# Patient Record
Sex: Female | Born: 1955
Health system: Southern US, Community
[De-identification: ages and names within clinical notes are randomized; demographics above are authoritative.]

## PROBLEM LIST (undated history)

## (undated) DIAGNOSIS — I10 Essential (primary) hypertension: Secondary | ICD-10-CM

## (undated) HISTORY — PX: TUBAL LIGATION: SHX77

---

## 1997-05-21 ENCOUNTER — Inpatient Hospital Stay (HOSPITAL_COMMUNITY): Admission: AD | Admit: 1997-05-21 | Discharge: 1997-05-21 | Payer: Self-pay | Admitting: *Deleted

## 1998-01-17 ENCOUNTER — Emergency Department (HOSPITAL_COMMUNITY): Admission: EM | Admit: 1998-01-17 | Discharge: 1998-01-17 | Payer: Self-pay | Admitting: Emergency Medicine

## 1998-01-17 ENCOUNTER — Encounter: Payer: Self-pay | Admitting: Emergency Medicine

## 2006-03-18 ENCOUNTER — Emergency Department (HOSPITAL_COMMUNITY): Admission: EM | Admit: 2006-03-18 | Discharge: 2006-03-18 | Payer: Self-pay | Admitting: Emergency Medicine

## 2006-03-20 ENCOUNTER — Emergency Department (HOSPITAL_COMMUNITY): Admission: EM | Admit: 2006-03-20 | Discharge: 2006-03-20 | Payer: Self-pay | Admitting: Emergency Medicine

## 2007-10-02 ENCOUNTER — Ambulatory Visit (HOSPITAL_COMMUNITY): Admission: RE | Admit: 2007-10-02 | Discharge: 2007-10-02 | Payer: Self-pay | Admitting: Family Medicine

## 2008-04-05 HISTORY — PX: COLON SURGERY: SHX602

## 2011-08-27 ENCOUNTER — Encounter (HOSPITAL_COMMUNITY): Payer: Self-pay | Admitting: Emergency Medicine

## 2011-08-27 ENCOUNTER — Emergency Department (HOSPITAL_COMMUNITY)
Admission: EM | Admit: 2011-08-27 | Discharge: 2011-08-27 | Disposition: A | Discharge status: Home / Self Care | Payer: No Typology Code available for payment source | Attending: Emergency Medicine | Admitting: Emergency Medicine

## 2011-08-27 ENCOUNTER — Emergency Department (HOSPITAL_COMMUNITY): Payer: No Typology Code available for payment source

## 2011-08-27 DIAGNOSIS — S161XXA Strain of muscle, fascia and tendon at neck level, initial encounter: Secondary | ICD-10-CM

## 2011-08-27 DIAGNOSIS — Y92410 Unspecified street and highway as the place of occurrence of the external cause: Secondary | ICD-10-CM | POA: Insufficient documentation

## 2011-08-27 DIAGNOSIS — S139XXA Sprain of joints and ligaments of unspecified parts of neck, initial encounter: Secondary | ICD-10-CM | POA: Insufficient documentation

## 2011-08-27 DIAGNOSIS — R51 Headache: Secondary | ICD-10-CM

## 2011-08-27 DIAGNOSIS — M542 Cervicalgia: Secondary | ICD-10-CM | POA: Insufficient documentation

## 2011-08-27 DIAGNOSIS — I10 Essential (primary) hypertension: Secondary | ICD-10-CM | POA: Insufficient documentation

## 2011-08-27 HISTORY — DX: Essential (primary) hypertension: I10

## 2011-08-27 MED ORDER — ONDANSETRON 8 MG PO TBDP
8.0000 mg | ORAL_TABLET | Freq: Once | ORAL | Status: AC
  Administered 2011-08-27: 8 mg via ORAL
  Filled 2011-08-27: qty 1

## 2011-08-27 MED ORDER — IBUPROFEN 800 MG PO TABS: 800.0000 mg | ORAL_TABLET | Freq: Three times a day (TID) | ORAL | Status: AC | PRN

## 2011-08-27 MED ORDER — HYDROCODONE-ACETAMINOPHEN 5-325 MG PO TABS: 1.0000 | ORAL_TABLET | Freq: Four times a day (QID) | ORAL | Status: AC | PRN

## 2011-08-27 NOTE — Discharge Instructions (Signed)
Your CT scans were normal here today.use ice and heat on her neck.  You will be more sore tomorrow and over the next week.

## 2011-08-27 NOTE — ED Provider Notes (Signed)
Medical screening examination/treatment/procedure(s) were performed by non-physician practitioner and as supervising physician I was immediately available for consultation/collaboration.   Lyanne Co, MD 08/27/11 1239
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
## 2012-02-16 ENCOUNTER — Ambulatory Visit (INDEPENDENT_AMBULATORY_CARE_PROVIDER_SITE_OTHER): Payer: Self-pay | Admitting: Family Medicine

## 2012-02-16 ENCOUNTER — Encounter: Payer: Self-pay | Admitting: Family Medicine

## 2012-02-16 VITALS — BP 123/84 | HR 86 | Temp 98.2°F | Ht 61.0 in | Wt 116.9 lb

## 2012-02-16 DIAGNOSIS — I10 Essential (primary) hypertension: Secondary | ICD-10-CM

## 2012-02-16 DIAGNOSIS — K635 Polyp of colon: Secondary | ICD-10-CM

## 2012-02-16 DIAGNOSIS — D126 Benign neoplasm of colon, unspecified: Secondary | ICD-10-CM

## 2012-02-16 NOTE — Patient Instructions (Addendum)
It was nice to meet you today, Monica Gutierrez. Your blood pressure is perfect.  Keep taking hydrochlorothiazide daily. Please find out who your stomach doctor is and call our clinic with their information. We need records from your stomach doctor. Call [facility] tomorrow to apply for an orange card. Once you have an orange card, schedule a complete physical exam with Dr. Tye Savoy.

## 2012-02-16 NOTE — Progress Notes (Signed)
  Subjective:    Patient ID: Monica Gutierrez, female    DOB: 02-07-1956, 56 y.o.   MRN: [id number]  HPI  Patient presents to clinic to establish care.  She has not seen a primary care doctor in a very long time.  She went to a free clinic and was told that her BP was elevated and she has been taking HCTZ since then.  Patient does not have insurance, but will apply for the orange card.  She says that she has a history of "stomach polyps."  She was seen by a GI physician in 2010, had a colonoscopy, and was told she had benign polyps (5 were removed).  Patient denies any constipation, diarrhea, or bloody stools at this time.  She cannot remember the name of the GI practice but says she will look for their card when she goes home.  She denies any unintentional weight loss or abdominal pain.  Patient would like to have a complete physical with pap smear.  She agrees to wait until she has an orange card.  I have reviewed and updated patient's current medications, allergies, PMH, FH, SH today.  Review of Systems  Per HPI    Objective:   Physical Exam  Constitutional: She appears well-nourished. No distress.  HENT:  Head: Normocephalic and atraumatic.  Abdominal: Soft. She exhibits no distension.       Assessment & Plan:

## 2012-02-16 NOTE — Assessment & Plan Note (Signed)
Patient says they were benign, but I cannot find records of this in Epic.  She will go home and look for the doctor's card, fill out ROI, and send it back to [place].

## 2012-02-16 NOTE — Assessment & Plan Note (Signed)
BP well controlled today.  Continue current regimen.  Return once she receives orange card for complete physical and fasting labs.

## 2013-11-22 ENCOUNTER — Encounter (INDEPENDENT_AMBULATORY_CARE_PROVIDER_SITE_OTHER): Payer: Self-pay

## 2013-11-22 ENCOUNTER — Other Ambulatory Visit: Payer: Self-pay | Admitting: Orthopedic Surgery

## 2013-11-22 ENCOUNTER — Ambulatory Visit
Admission: RE | Admit: 2013-11-22 | Discharge: 2013-11-22 | Disposition: A | Discharge status: Home / Self Care | Payer: BC Managed Care – PPO | Source: Ambulatory Visit | Attending: Orthopedic Surgery | Admitting: Orthopedic Surgery

## 2013-11-22 DIAGNOSIS — M779 Enthesopathy, unspecified: Secondary | ICD-10-CM

## 2014-01-02 ENCOUNTER — Other Ambulatory Visit: Payer: Self-pay | Admitting: Orthopedic Surgery

## 2014-01-02 DIAGNOSIS — M545 Low back pain: Secondary | ICD-10-CM

## 2014-01-07 ENCOUNTER — Ambulatory Visit
Admission: RE | Admit: 2014-01-07 | Discharge: 2014-01-07 | Disposition: A | Discharge status: Home / Self Care | Payer: BC Managed Care – PPO | Source: Ambulatory Visit | Attending: Orthopedic Surgery | Admitting: Orthopedic Surgery

## 2014-01-07 DIAGNOSIS — M545 Low back pain: Secondary | ICD-10-CM

## 2014-02-15 ENCOUNTER — Other Ambulatory Visit: Payer: Self-pay | Admitting: Orthopedic Surgery

## 2014-02-15 DIAGNOSIS — M5126 Other intervertebral disc displacement, lumbar region: Secondary | ICD-10-CM

## 2014-02-15 DIAGNOSIS — M5440 Lumbago with sciatica, unspecified side: Secondary | ICD-10-CM

## 2014-02-19 ENCOUNTER — Ambulatory Visit
Admission: RE | Admit: 2014-02-19 | Discharge: 2014-02-19 | Disposition: A | Discharge status: Home / Self Care | Payer: BC Managed Care – PPO | Source: Ambulatory Visit | Attending: Orthopedic Surgery | Admitting: Orthopedic Surgery

## 2014-02-19 VITALS — BP 119/80 | HR 93

## 2014-02-19 DIAGNOSIS — M545 Low back pain: Secondary | ICD-10-CM

## 2014-02-19 DIAGNOSIS — M5440 Lumbago with sciatica, unspecified side: Secondary | ICD-10-CM

## 2014-02-19 DIAGNOSIS — M5126 Other intervertebral disc displacement, lumbar region: Secondary | ICD-10-CM

## 2014-02-19 MED ORDER — METHYLPREDNISOLONE ACETATE 40 MG/ML INJ SUSP (RADIOLOG
120.0000 mg | Freq: Once | INTRAMUSCULAR | Status: AC
  Administered 2014-02-19: 120 mg via EPIDURAL

## 2014-02-19 MED ORDER — IOHEXOL 180 MG/ML  SOLN
1.0000 mL | Freq: Once | INTRAMUSCULAR | Status: AC | PRN
  Administered 2014-02-19: 1 mL via EPIDURAL

## 2014-02-19 NOTE — Discharge Instructions (Signed)

## 2015-01-02 ENCOUNTER — Ambulatory Visit
Admission: RE | Admit: 2015-01-02 | Discharge: 2015-01-02 | Disposition: A | Discharge status: Home / Self Care | Payer: 59 | Source: Ambulatory Visit | Attending: Orthopedic Surgery | Admitting: Orthopedic Surgery

## 2015-01-02 ENCOUNTER — Other Ambulatory Visit: Payer: Self-pay | Admitting: Orthopedic Surgery

## 2015-01-02 DIAGNOSIS — M545 Low back pain: Secondary | ICD-10-CM

## 2015-01-09 ENCOUNTER — Other Ambulatory Visit: Payer: Self-pay

## 2015-01-13 ENCOUNTER — Other Ambulatory Visit: Payer: Self-pay | Admitting: Orthopedic Surgery

## 2015-01-13 DIAGNOSIS — M5432 Sciatica, left side: Secondary | ICD-10-CM

## 2015-01-13 DIAGNOSIS — M5136 Other intervertebral disc degeneration, lumbar region: Secondary | ICD-10-CM

## 2015-01-16 ENCOUNTER — Ambulatory Visit
Admission: RE | Admit: 2015-01-16 | Discharge: 2015-01-16 | Disposition: A | Discharge status: Home / Self Care | Payer: 59 | Source: Ambulatory Visit | Attending: Orthopedic Surgery | Admitting: Orthopedic Surgery

## 2015-01-16 DIAGNOSIS — M5432 Sciatica, left side: Secondary | ICD-10-CM

## 2015-01-16 DIAGNOSIS — M5136 Other intervertebral disc degeneration, lumbar region: Secondary | ICD-10-CM

## 2015-01-16 MED ORDER — METHYLPREDNISOLONE ACETATE 40 MG/ML INJ SUSP (RADIOLOG
120.0000 mg | Freq: Once | INTRAMUSCULAR | Status: AC
  Administered 2015-01-16: 120 mg via EPIDURAL

## 2015-01-16 MED ORDER — IOHEXOL 180 MG/ML  SOLN
1.0000 mL | Freq: Once | INTRAMUSCULAR | Status: DC | PRN
  Administered 2015-01-16: 1 mL via EPIDURAL

## 2015-01-16 NOTE — Discharge Instructions (Signed)

## 2015-08-26 ENCOUNTER — Ambulatory Visit (INDEPENDENT_AMBULATORY_CARE_PROVIDER_SITE_OTHER): Payer: BLUE CROSS/BLUE SHIELD | Admitting: Physician Assistant

## 2015-08-26 VITALS — BP 125/82 | HR 87 | Temp 98.1°F | Resp 16 | Ht 60.0 in | Wt 116.2 lb

## 2015-08-26 DIAGNOSIS — K623 Rectal prolapse: Secondary | ICD-10-CM

## 2015-08-26 DIAGNOSIS — K6289 Other specified diseases of anus and rectum: Secondary | ICD-10-CM | POA: Diagnosis not present

## 2015-08-26 LAB — POC MICROSCOPIC URINALYSIS (UMFC): MUCUS RE: ABSENT

## 2015-08-26 LAB — POCT WET + KOH PREP
Trich by wet prep: ABSENT
Yeast by KOH: ABSENT
Yeast by wet prep: ABSENT

## 2015-08-26 LAB — POCT URINALYSIS DIP (MANUAL ENTRY)
Bilirubin, UA: NEGATIVE
Blood, UA: NEGATIVE
Glucose, UA: NEGATIVE
Ketones, POC UA: NEGATIVE
LEUKOCYTES UA: NEGATIVE
Nitrite, UA: NEGATIVE
PROTEIN UA: NEGATIVE
Spec Grav, UA: <=1.005
UROBILINOGEN UA: 0.2
pH, UA: 6.5

## 2015-08-26 NOTE — Patient Instructions (Addendum)
IF you received an x-ray today, you will receive an invoice from [facility]. Please contact [facility] at [phone] with questions or concerns regarding your invoice.   IF you received labwork today, you will receive an invoice from [employer]. Please contact [facility] at [phone] with questions or concerns regarding your invoice.   Our billing staff will not be able to assist you with questions regarding bills from these companies.  You will be contacted with the lab results as soon as they are available. The fastest way to get your results is to activate your My Chart account. Instructions are located on the last page of this paperwork. If you have not heard from [place] regarding the results in 2 weeks, please contact this office.    This appears to be a rectal prolapse.   I am sending you to a Education officer, environmental.  Please await contact.    Rectal Prolapse, Adult Rectal prolapse happens when the inside of the final section of the large intestine (rectum) pushes out through the anal opening. With this condition, the lower part of the rectum turns inside out. At first, rectal prolapse may be temporary. It may happen only when you are having a bowel movement. Over time, the prolapse will likely get worse. It may start to happen more often and cause uncomfortable symptoms. Eventually, the prolapse may happen when you are walking or simply standing. Surgery is often needed for this condition. CAUSES  This condition may result from weakness of the muscles that attach the rectum to the inside of the lower abdomen. The exact cause of this muscle weakness is not known.  RISK FACTORS This condition is more likely to develop in:  Women who are 59 years of age or older.  People with a history of constipation.  People with a history of hemorrhoids.  People who have a lower spinal cord injury.  Women who have been pregnant many times.  People  who have had rectal surgery.  Men who have an enlarged prostate gland.  People who have chronic obstructive pulmonary disease (COPD).  People who have cystic fibrosis. SYMPTOMS The main symptom of this condition is a red bump of tissue sticking out from your anus. At first, the bump may only appear after a bowel movement. It may then start to appear more often. Other symptoms may include:  Discomfort in the anus and rectum.  Constipation.  Diarrhea.  Inability to control bowel movements (incontinence).  Rectal bleeding. DIAGNOSIS  This condition may be diagnosed based on your symptoms and a physical exam. During the exam, you may be asked to squat and strain as though you are having a bowel movement. You may also have tests, such as:  A rectal exam using a flexible scope (sigmoidoscopy or colonoscopy).  A procedure that involves taking X-rays of your rectum after a dye (contrast material) is injected into the rectum (defecogram). TREATMENT  This condition is usually treated with surgery to repair the weakened muscles and to reconnect the rectum to attachments inside the lower abdomen. Other treatment options may include:  Pushing the prolapsed area back into the rectum (reduction). Your health care provider may do this by gently pushing it back in using a moist cloth. The health care provider may also show you how to do this at home if the prolapse occurs again.  Medicines to prevent constipation and straining. This may include laxatives or stool softeners. HOME CARE INSTRUCTIONS General Instructions  Take  over-the-counter and prescription medicines only as told by your health care provider.  Do not strain to have a bowel movement.  Do not lift anything that is heavier than 10 lb (4.5 kg).  Follow instructions from your health care provider about what to do if the prolapse occurs again and does not go back in. This may involve lying on your side and using a moist cloth to  gently press the lump into your rectum.  Keep all follow-up visits as told by your health care provider. This is important. Preventing Constipation  Eat foods that have a lot of fiber, such as fruits, vegetables, whole grains, and beans.  Limit foods high in fat and processed sugars, such as french fries, hamburgers, cookies, candies, and soda.  Drink enough fluids to keep your urine clear or pale yellow. SEEK MEDICAL CARE IF:  You have a fever.  Your prolapse cannot be reduced at home.  You have constipation or diarrhea.  You have mild rectal bleeding. SEEK IMMEDIATE MEDICAL CARE IF:  You have very bad rectal pain.  You bleed heavily from your rectum.   This information is not intended to replace advice given to you by your health care provider. Make sure you discuss any questions you have with your health care provider.   Document Released: 12/11/2014 Document Reviewed: 04/10/2014 Elsevier Interactive Patient Education [employer].

## 2015-08-31 NOTE — Progress Notes (Signed)
[facility] [address], [place] [address] [id number]  Date:  08/26/2015   Name:  Monica Gutierrez   DOB:  March 04, 1956   MRN:  [id number]  PCP:  Beverely Low, MD   Chief Complaint  Patient presents with  . Hemorrhoids    feel pressure on her rectum - feel like she has to have BM - don't know for sure what is it   x 2 months    History of Present Illness:  Monica Gutierrez is a 60 y.o. female patient who presents to [facility] for cc of anal pressure. Ptaient states that she had 2 months of rectal pressure.  She will have the sensation that she has to defecate, however she does not have to at the time.  It comes on with great urgency.  She walks a lot, and must derail from her walking back to the home, to use the bathroom, though nothing is expelled.  This is agravated by the walking, but also standing.  Relieved with sitting and lying down.  She has no urinary urgency, hematuria, or dysuria, or frequency.  No vaginal changes, discharge or irritation.  No fecal incontinence.  She has felt the same sensation with her vagina, and states that she limits sexual encounter to once per month due to the fear of a vaginal prolapse.  No heavy constipation.  No lifting heavy weighted materials.   She has 4 children.  Last 19 years ago, cesarian section.  3 vaginal deliveries.  Last colonoscopy 4 years ago, with request for recheck q5 years.   Pap last 2 years ago.    Patient Active Problem List   Diagnosis Date Noted  . Essential hypertension, benign 02/16/2012  . Colon polyps 02/16/2012    Past Medical History  Diagnosis Date  . Hypertension     Past Surgical History  Procedure Laterality Date  . Cesarean section    . Colon surgery  2010    5 polyps removed 2 years ago  . Tubal ligation      Social History  Substance Use Topics  . Smoking status: Light Tobacco Smoker -- 0.25 packs/day for 15 years    Types: Cigarettes  . Smokeless tobacco: None  . Alcohol Use: Yes      Comment: one beer on the holidays    History reviewed. No pertinent family history.  No Known Allergies  Medication list has been reviewed and updated.  Current Outpatient Prescriptions on File Prior to Visit  Medication Sig Dispense Refill  . Boswellia-Glucosamine-Vit D (GLUCOSAMINE COMPLEX PO) Take 1 capsule by mouth daily.    . hydrochlorothiazide (HYDRODIURIL) 25 MG tablet Take 25 mg by mouth daily.    Marland Kitchen OVER THE COUNTER MEDICATION Take 1 tablet by mouth daily. Allergy tablet, pink, $ over the counter at [facility]     No current facility-administered medications on file prior to visit.    ROS ROS otherwise unremarkable unless listed above.   Physical Examination: BP 125/82 mmHg  Pulse 87  Temp(Src) 98.1 F (36.7 C) (Oral)  Resp 16  Ht 5' (1.524 m)  Wt 116 lb 3.2 oz (52.708 kg)  BMI 22.69 kg/m2  SpO2 99% Ideal Body Weight: Weight in (lb) to have BMI = 25: 127.7  Physical Exam  Constitutional: She is oriented to person, place, and time. She appears well-developed and well-nourished. No distress.  HENT:  Head: Normocephalic and atraumatic.  Right Ear: External ear normal.  Left Ear:  External ear normal.  Eyes: Conjunctivae and EOM are normal. Pupils are equal, round, and reactive to light.  Cardiovascular: Normal rate.   Pulmonary/Chest: Effort normal. No respiratory distress.  Abdominal: Soft. Normal appearance and bowel sounds are normal. There is no hepatosplenomegaly. There is no tenderness.  Genitourinary: Vagina normal. Rectal exam shows anal tone normal. Pelvic exam was performed with patient supine. There is no rash on the right labia. There is no rash on the left labia. Cervix exhibits no discharge. Right adnexum displays no mass and no tenderness. Left adnexum displays no mass and no tenderness.  With standing, appears to have prolapse of the rectal with evaluation--this is with movement and manipulation, so somewhat unclear.    Neurological: She is alert and  oriented to person, place, and time.  Skin: She is not diaphoretic.  Psychiatric: She has a normal mood and affect. Her behavior is normal.    Results for orders placed or performed in visit on 08/26/15  POCT urinalysis dipstick  Result Value Ref Range   Color, UA yellow yellow   Clarity, UA clear clear   Glucose, UA negative negative   Bilirubin, UA negative negative   Ketones, POC UA negative negative   Spec Grav, UA <=1.005    Blood, UA negative negative   pH, UA 6.5    Protein Ur, POC negative negative   Urobilinogen, UA 0.2    Nitrite, UA Negative Negative   Leukocytes, UA Negative Negative  POCT Microscopic Urinalysis ([facility])  Result Value Ref Range   WBC,UR,HPF,POC None None WBC/hpf   RBC,UR,HPF,POC None None RBC/hpf   Bacteria None None, Too numerous to count   Mucus Absent Absent   Epithelial Cells, UR Per Microscopy None None, Too numerous to count cells/hpf  POCT Wet + KOH Prep  Result Value Ref Range   Yeast by KOH Absent Present, Absent   Yeast by wet prep Absent Present, Absent   WBC by wet prep Few None, Few, Too numerous to count   Clue Cells Wet Prep HPF POC None None, Too numerous to count   Trich by wet prep Absent Present, Absent   Bacteria Wet Prep HPF POC None None, Few, Too numerous to count   Epithelial Cells By [employer] ([facility]) Few None, Few, Too numerous to count   RBC,UR,HPF,POC None None RBC/hpf    Assessment and Plan: Monica Gutierrez is a 60 y.o. female who is here today for urinary urgency.  Appears to be rectal prolapse.  General surgery consult appreciated at this time.  I have advised that she continue to keep her stool loose.      Rectal prolapse - Plan: Ambulatory referral to General Surgery  Anal irritation - Plan: POCT urinalysis dipstick, POCT Microscopic Urinalysis ([facility]), POCT Wet + KOH Prep, Ambulatory referral to General Surgery   Ivar Drape, PA-C [facility] 08/31/2015 10:09  PM

## 2015-09-10 ENCOUNTER — Ambulatory Visit (INDEPENDENT_AMBULATORY_CARE_PROVIDER_SITE_OTHER): Payer: BLUE CROSS/BLUE SHIELD | Admitting: Physician Assistant

## 2015-09-10 VITALS — BP 102/70 | HR 93 | Temp 97.9°F | Resp 16 | Ht 60.0 in | Wt 115.8 lb

## 2015-09-10 DIAGNOSIS — Z1211 Encounter for screening for malignant neoplasm of colon: Secondary | ICD-10-CM

## 2015-09-10 DIAGNOSIS — Z131 Encounter for screening for diabetes mellitus: Secondary | ICD-10-CM

## 2015-09-10 DIAGNOSIS — Z0000 Encounter for general adult medical examination without abnormal findings: Secondary | ICD-10-CM

## 2015-09-10 DIAGNOSIS — Z1159 Encounter for screening for other viral diseases: Secondary | ICD-10-CM | POA: Diagnosis not present

## 2015-09-10 DIAGNOSIS — Z130 Encounter for screening for diseases of the blood and blood-forming organs and certain disorders involving the immune mechanism: Secondary | ICD-10-CM | POA: Diagnosis not present

## 2015-09-10 DIAGNOSIS — Z13220 Encounter for screening for lipoid disorders: Secondary | ICD-10-CM | POA: Diagnosis not present

## 2015-09-10 DIAGNOSIS — Z13228 Encounter for screening for other metabolic disorders: Secondary | ICD-10-CM

## 2015-09-10 DIAGNOSIS — Z1329 Encounter for screening for other suspected endocrine disorder: Secondary | ICD-10-CM

## 2015-09-10 LAB — CBC
HEMATOCRIT: 44 % (ref 35.0–45.0)
HEMOGLOBIN: 14.5 g/dL (ref 11.7–15.5)
MCH: 31.6 pg (ref 27.0–33.0)
MCHC: 33 g/dL (ref 32.0–36.0)
MCV: 95.9 fL (ref 80.0–100.0)
MPV: 8.8 fL (ref 7.5–12.5)
PLATELETS: 204 10*3/uL (ref 140–400)
RBC: 4.59 MIL/uL (ref 3.80–5.10)
RDW: 13.3 % (ref 11.0–15.0)
WBC: 5.7 10*3/uL (ref 3.8–10.8)

## 2015-09-10 LAB — HEMOGLOBIN A1C
Hgb A1c MFr Bld: 5.9 % — ABNORMAL HIGH (ref <5.7)
MEAN PLASMA GLUCOSE: 123 mg/dL

## 2015-09-10 LAB — COMPLETE METABOLIC PANEL WITH GFR
ALBUMIN: 4.5 g/dL (ref 3.6–5.1)
ALK PHOS: 58 U/L (ref 33–130)
ALT: 16 U/L (ref 6–29)
AST: 18 U/L (ref 10–35)
BILIRUBIN TOTAL: 0.4 mg/dL (ref 0.2–1.2)
BUN: 11 mg/dL (ref 7–25)
CO2: 26 mmol/L (ref 20–31)
CREATININE: 0.72 mg/dL (ref 0.50–1.05)
Calcium: 9.3 mg/dL (ref 8.6–10.4)
Chloride: 102 mmol/L (ref 98–110)
GFR, Est Non African American: >89 mL/min (ref >=60)
Glucose, Bld: 101 mg/dL — ABNORMAL HIGH (ref 65–99)
Potassium: 3.9 mmol/L (ref 3.5–5.3)
Sodium: 141 mmol/L (ref 135–146)
TOTAL PROTEIN: 7.4 g/dL (ref 6.1–8.1)

## 2015-09-10 LAB — LIPID PANEL
CHOLESTEROL: 261 mg/dL — AB (ref 125–200)
HDL: 60 mg/dL (ref >=46)
LDL CALC: 156 mg/dL — AB (ref <130)
Total CHOL/HDL Ratio: 4.4 Ratio (ref <=5.0)
Triglycerides: 226 mg/dL — ABNORMAL HIGH (ref <150)
VLDL: 45 mg/dL — AB (ref <30)

## 2015-09-10 LAB — HEPATITIS C ANTIBODY: HCV AB: NEGATIVE

## 2015-09-10 NOTE — Patient Instructions (Addendum)
IF you received an x-ray today, you will receive an invoice from [facility]. Please contact [facility] at [phone] with questions or concerns regarding your invoice.   IF you received labwork today, you will receive an invoice from [employer]. Please contact [facility] at [phone] with questions or concerns regarding your invoice.   Our billing staff will not be able to assist you with questions regarding bills from these companies.  You will be contacted with the lab results as soon as they are available. The fastest way to get your results is to activate your My Chart account. Instructions are located on the last page of this paperwork. If you have not heard from [place] regarding the results in 2 weeks, please contact this office.    Great job exercising, and your diet is sounds very good.  I will check your blood results, and we will see if there is anything that we need to discuss. I will get the medical records from Dr. Jimmye Norman in determining if when your colonoscopy and pap smear is needed.   We will have your lab results within 14 days.  Keeping You Healthy  Get These Tests  Blood Pressure- Have your blood pressure checked by your healthcare provider at least once a year.  Normal blood pressure is 120/80.  Weight- Have your body mass index (BMI) calculated to screen for obesity.  BMI is a measure of body fat based on height and weight.  You can calculate your own BMI at [employer].it  Cholesterol- Have your cholesterol checked every year.  Diabetes- Have your blood sugar checked every year if you have high blood pressure, high cholesterol, a family history of diabetes or if you are overweight.  Pap Test - Have a pap test every 1 to 5 years if you have been sexually active.  If you are older than 65 and recent pap tests have been normal you may not need additional pap tests.  In addition, if you have had a  hysterectomy  for benign disease additional pap tests are not necessary.  Mammogram-Yearly mammograms are essential for early detection of breast cancer  Screening for Colon Cancer- Colonoscopy starting at age 76. Screening may begin sooner depending on your family history and other health conditions.  Follow up colonoscopy as directed by your Gastroenterologist.  Screening for Osteoporosis- Screening begins at age 31 with bone density scanning, sooner if you are at higher risk for developing Osteoporosis.  Get these medicines  Calcium with Vitamin D- Your body requires 1200-1500 mg of Calcium a day and [id number] IU of Vitamin D a day.  You can only absorb 500 mg of Calcium at a time therefore Calcium must be taken in 2 or 3 separate doses throughout the day.  Hormones- Hormone therapy has been associated with increased risk for certain cancers and heart disease.  Talk to your healthcare provider about if you need relief from menopausal symptoms.  Aspirin- Ask your healthcare provider about taking Aspirin to prevent Heart Disease and Stroke.  Get these Immuniztions  Flu shot- Every fall  Pneumonia shot- Once after the age of 2; if you are younger ask your healthcare provider if you need a pneumonia shot.  Tetanus- Every ten years.  Zostavax- Once after the age of 65 to prevent shingles.  Take these steps  Don't smoke- Your healthcare provider can help you quit. For tips on how to quit, ask your healthcare provider or go to www.smokefree.gov or call  1-800 QUIT-NOW.  Be physically active- Exercise 5 days a week for a minimum of 30 minutes.  If you are not already physically active, start slow and gradually work up to 30 minutes of moderate physical activity.  Try walking, dancing, bike riding, swimming, etc.  Eat a healthy diet- Eat a variety of healthy foods such as fruits, vegetables, whole grains, low fat milk, low fat cheeses, yogurt, lean meats, chicken, fish, eggs, dried beans,  tofu, etc.  For more information go to www.thenutritionsource.org  Dental visit- Brush and floss teeth twice daily; visit your dentist twice a year.  Eye exam- Visit your Optometrist or Ophthalmologist yearly.  Drink alcohol in moderation- Limit alcohol intake to one drink or less a day.  Never drink and drive.  Depression- Your emotional health is as important as your physical health.  If you're feeling down or losing interest in things you normally enjoy, please talk to your healthcare provider.  Seat Belts- can save your life; always wear one  Smoke/Carbon Monoxide detectors- These detectors need to be installed on the appropriate level of your home.  Replace batteries at least once a year.  Violence- If anyone is threatening or hurting you, please tell your healthcare provider.  Living Will/ Health care power of attorney- Discuss with your healthcare provider and family.

## 2015-09-10 NOTE — Progress Notes (Signed)
[facility] [address] [place] [address] [phone]  Date:  09/10/2015   Name:  Monica Gutierrez   DOB:  October 22, 1955   MRN:  [id number]  PCP:  Beverlyn Roux, MD    History of Present Illness:  Monica Gutierrez is a 60 y.o. female patient who presents to [facility] for annual physical exam.    Diet: Rice, vegetables, carrots, lettuce, tomato, banana, apple.  Chicken, beef, pork, and fish. She avoids caffeine.  1 cup coffee, no sodas.    BM:  Stools loose, no blood in stool, constipation, or diarrhea.    Urination: no dysuria, hematuria, or frequency  Sleep: 8 hours through the night, sleeps through night  Exercise with walking daily for at least 30 minutes daily.    She works at a [employer] as a Freight forwarder. Married, 26 years.    EtOH: occasional, 1 beer Tobacco use: 1-2 cigarettes per day.   Substance use:    She was given prednisone 5 mg, daily for her joint     Patient Active Problem List   Diagnosis Date Noted  . Essential hypertension, benign 02/16/2012  . Colon polyps 02/16/2012    Past Medical History  Diagnosis Date  . Hypertension     Past Surgical History  Procedure Laterality Date  . Cesarean section    . Colon surgery  2010    5 polyps removed 2 years ago  . Tubal ligation      Social History  Substance Use Topics  . Smoking status: Light Tobacco Smoker -- 0.25 packs/day for 15 years    Types: Cigarettes  . Smokeless tobacco: None  . Alcohol Use: Yes     Comment: one beer on the holidays    History reviewed. No pertinent family history.  No Known Allergies  Medication list has been reviewed and updated.  Current Outpatient Prescriptions on File Prior to Visit  Medication Sig Dispense Refill  . hydrochlorothiazide (HYDRODIURIL) 25 MG tablet Take 25 mg by mouth daily.    Marland Kitchen OVER THE COUNTER MEDICATION Take 1 tablet by mouth daily. Allergy tablet, pink, $ over the counter at [facility]    . Boswellia-Glucosamine-Vit D  (GLUCOSAMINE COMPLEX PO) Take 1 capsule by mouth daily. Reported on 09/10/2015     No current facility-administered medications on file prior to visit.    Review of Systems  Constitutional: Negative for fever and chills.  HENT: Negative for ear discharge, ear pain and sore throat.   Eyes: Negative for blurred vision and double vision.  Respiratory: Negative for cough, shortness of breath and wheezing.   Cardiovascular: Negative for chest pain, palpitations and leg swelling.  Gastrointestinal: Negative for nausea, vomiting and diarrhea.  Genitourinary: Negative for dysuria, frequency and hematuria.  Skin: Negative for itching and rash.  Neurological: Negative for dizziness and headaches.     Physical Examination: BP 102/70 mmHg  Pulse 93  Temp(Src) 97.9 F (36.6 C) (Oral)  Resp 16  Ht 5' (1.524 m)  Wt 115 lb 12.8 oz (52.527 kg)  BMI 22.62 kg/m2  SpO2 98% Ideal Body Weight: Weight in (lb) to have BMI = 25: 127.7  Physical Exam  Constitutional: She is oriented to person, place, and time. She appears well-developed and well-nourished. No distress.  HENT:  Head: Normocephalic and atraumatic.  Right Ear: Tympanic membrane, external ear and ear canal normal.  Left Ear: Tympanic membrane, external ear and ear canal normal.  Nose: Right sinus exhibits no  maxillary sinus tenderness and no frontal sinus tenderness. Left sinus exhibits no maxillary sinus tenderness and no frontal sinus tenderness.  Mouth/Throat: Oropharynx is clear and moist. No uvula swelling. No oropharyngeal exudate, posterior oropharyngeal edema or posterior oropharyngeal erythema.  Eyes: Conjunctivae and EOM are normal. Pupils are equal, round, and reactive to light.  Neck: Normal range of motion. Neck supple. No thyromegaly present.  Cardiovascular: Normal rate, regular rhythm, normal heart sounds and intact distal pulses.  Exam reveals no gallop, no distant heart sounds and no friction rub.   No murmur  heard. Pulmonary/Chest: Effort normal and breath sounds normal. No respiratory distress. She has no decreased breath sounds. She has no wheezes. She has no rhonchi.  Abdominal: Soft. Bowel sounds are normal. She exhibits no distension and no mass. There is no tenderness.  Musculoskeletal: Normal range of motion. She exhibits no edema or tenderness.  Lymphadenopathy:       Head (right side): No submandibular, no tonsillar, no preauricular and no posterior auricular adenopathy present.       Head (left side): No submandibular, no tonsillar, no preauricular and no posterior auricular adenopathy present.    She has no cervical adenopathy.  Neurological: She is alert and oriented to person, place, and time. No cranial nerve deficit. She exhibits normal muscle tone. Coordination normal.  Skin: Skin is warm and dry. She is not diaphoretic.  Psychiatric: She has a normal mood and affect. Her behavior is normal.     Assessment and Plan: Monica Gutierrez is a 60 y.o. female who is here today for an annual physical exam.  -borderline diabetes.  Discussed better eating habits.   -pap performed with gynecologist. Annual physical exam - Plan: CBC, COMPLETE METABOLIC PANEL WITH GFR, TSH, Lipid panel, Hemoglobin A1c  Screening for deficiency anemia - Plan: CBC  Screening for metabolic disorder - Plan: COMPLETE METABOLIC PANEL WITH GFR  Screening for lipid disorders - Plan: Lipid panel  Screening for thyroid disorder - Plan: TSH  Screening for diabetes mellitus - Plan: Hemoglobin A1c  Need for hepatitis C screening test - Plan: Hepatitis C antibody  Screening for colon cancer  Ivar Drape, PA-C [facility] 09/10/2015 9:39 AM

## 2015-09-11 LAB — TSH: TSH: 1.02 mIU/L

## 2015-09-16 ENCOUNTER — Ambulatory Visit (INDEPENDENT_AMBULATORY_CARE_PROVIDER_SITE_OTHER): Payer: BLUE CROSS/BLUE SHIELD | Admitting: Physician Assistant

## 2015-09-16 VITALS — BP 116/70 | HR 84 | Temp 97.9°F | Resp 16 | Ht 60.0 in | Wt 115.0 lb

## 2015-09-16 DIAGNOSIS — M545 Low back pain: Secondary | ICD-10-CM

## 2015-09-16 DIAGNOSIS — M5416 Radiculopathy, lumbar region: Secondary | ICD-10-CM | POA: Diagnosis not present

## 2015-09-16 DIAGNOSIS — I10 Essential (primary) hypertension: Secondary | ICD-10-CM | POA: Diagnosis not present

## 2015-09-16 DIAGNOSIS — M79642 Pain in left hand: Secondary | ICD-10-CM | POA: Diagnosis not present

## 2015-09-16 LAB — SEDIMENTATION RATE: Sed Rate: 10 mm/hr (ref 0–30)

## 2015-09-16 MED ORDER — HYDROCHLOROTHIAZIDE 25 MG PO TABS: 25.0000 mg | ORAL_TABLET | Freq: Every day | ORAL | Status: DC

## 2015-09-16 NOTE — Patient Instructions (Addendum)
     IF you received an x-ray today, you will receive an invoice from [facility]. Please contact [facility] at [phone] with questions or concerns regarding your invoice.   IF you received labwork today, you will receive an invoice from [employer]. Please contact [facility] at [phone] with questions or concerns regarding your invoice.   Our billing staff will not be able to assist you with questions regarding bills from these companies.  You will be contacted with the lab results as soon as they are available. The fastest way to get your results is to activate your My Chart account. Instructions are located on the last page of this paperwork. If you have not heard from [place] regarding the results in 2 weeks, please contact this office.    This appears to be arthritis.  Let's do tylenol as needed.  Take with food.  I am referring you to an orthopedist.  Please await the contact.  Please obtain a fish oil/omega-3 fatty acid supplement.    Arthritis Arthritis means joint pain. It can also mean joint disease. A joint is a place where bones come together. People who have arthritis may have:  Red joints.  Swollen joints.  Stiff joints.  Warm joints.  A fever.  A feeling of being sick. HOME CARE Pay attention to any changes in your symptoms. Take these actions to help with your pain and swelling. Medicines  Take over-the-counter and prescription medicines only as told by your doctor.  Do not take aspirin for pain if your doctor says that you may have gout. Activities  Rest your joint if your doctor tells you to.  Avoid activities that make the pain worse.  Exercise your joint regularly as told by your doctor. Try doing exercises like:  Swimming.  Water aerobics.  Biking.  Walking. Joint Care  If your joint is swollen, keep it raised (elevated) if told by your doctor.  If your joint feels stiff in the morning, try taking  a warm shower.  If you have diabetes, do not apply heat without asking your doctor.  If told, apply heat to the joint:  Put a towel between the joint and the hot pack or heating pad.  Leave the heat on the area for 20-30 minutes.  If told, apply ice to the joint:  Put ice in a plastic bag.  Place a towel between your skin and the bag.  Leave the ice on for 20 minutes, 2-3 times per day.  Keep all follow-up visits as told by your doctor. GET HELP IF:  The pain gets worse.  You have a fever. GET HELP RIGHT AWAY IF:  You have very bad pain in your joint.  You have swelling in your joint.  Your joint is red.  Many joints become painful and swollen.  You have very bad back pain.  Your leg is very weak.  You cannot control your pee (urine) or poop (stool).   This information is not intended to replace advice given to you by your health care provider. Make sure you discuss any questions you have with your health care provider.   Document Released: 06/16/2009 Document Revised: 12/11/2014 Document Reviewed: 06/17/2014 Elsevier Interactive Patient Education [employer].

## 2015-09-16 NOTE — Progress Notes (Signed)
[facility] [address] [place] [address] [phone]  Date:  09/16/2015   Name:  Monica Gutierrez   DOB:  May 08, 1955   MRN:  [id number]  PCP:  Beverlyn Roux, MD    History of Present Illness:  Monica Gutierrez is a 61 y.o. female patient who presents to [facility] for cc of hand concerns, back pain, and blood pressure. Back: lower lumbar back pain with recent increased pain in the last week that radiates to left buttocks.  She will have numbness and tingling to the leg.  Patient is an avid walker and stretches daily.  She has had epidural injections once a year.  Followed by Dr. Marily Memos who recently retired.  No weakness.  No incontinence.   Hand: left 5th digit with swelling that has increased over the last few years.  It can be painful at times.  She was given prednisone by her pcp, which she takes one as needed (5mg ).  She reports no hx of RA.  No joint swelling of toes, ankle, knees, wrist.   BP refill.  Complaint on medication.  No chest pains, palpitations, sob, leg swelling, or vision changes.  No side effect.  No hx of strokes or TIA.   Patient Active Problem List   Diagnosis Date Noted  . Essential hypertension, benign 02/16/2012  . Colon polyps 02/16/2012    Past Medical History  Diagnosis Date  . Hypertension     Past Surgical History  Procedure Laterality Date  . Cesarean section    . Colon surgery  2010    5 polyps removed 2 years ago  . Tubal ligation      Social History  Substance Use Topics  . Smoking status: Light Tobacco Smoker -- 0.25 packs/day for 15 years    Types: Cigarettes  . Smokeless tobacco: None  . Alcohol Use: Yes     Comment: one beer on the holidays    History reviewed. No pertinent family history.  No Known Allergies  Medication list has been reviewed and updated.  Current Outpatient Prescriptions on File Prior to Visit  Medication Sig Dispense Refill  . Boswellia-Glucosamine-Vit D (GLUCOSAMINE COMPLEX PO) Take 1  capsule by mouth daily. Reported on 09/10/2015    . Calcium Carb-Cholecalciferol (CALCIUM + D3 PO) Take by mouth.    . hydrochlorothiazide (HYDRODIURIL) 25 MG tablet Take 25 mg by mouth daily.    Marland Kitchen LORATADINE PO Take by mouth.    Marland Kitchen OVER THE COUNTER MEDICATION Take 1 tablet by mouth daily. Allergy tablet, pink, $ over the counter at [facility]    . UNABLE TO FIND Med Name: Zakuror balls     No current facility-administered medications on file prior to visit.    ROS ROS otherwise unremarkable unless listed above.   Physical Examination: BP 116/70 mmHg  Pulse 84  Temp(Src) 97.9 F (36.6 C) (Oral)  Resp 16  Ht 5' (1.524 m)  Wt 115 lb (52.164 kg)  BMI 22.46 kg/m2  SpO2 96% Ideal Body Weight: Weight in (lb) to have BMI = 25: 127.7  Physical Exam  Constitutional: She is oriented to person, place, and time. She appears well-developed and well-nourished. No distress.  HENT:  Head: Normocephalic and atraumatic.  Right Ear: External ear normal.  Left Ear: External ear normal.  Eyes: Conjunctivae and EOM are normal. Pupils are equal, round, and reactive to light.  Cardiovascular: Normal rate, regular rhythm and normal heart sounds.  Exam reveals  no gallop and no friction rub.   No murmur heard. Pulmonary/Chest: Effort normal. No respiratory distress.  Musculoskeletal:       Lumbar back: She exhibits bony tenderness. She exhibits normal range of motion and no swelling.  Back: No erythema, swelling, or spasms appreciated.  Tender along the lumbar spinous.  Normal rom throughout hip/lower extremity.  Negative straight leg raise bilaterally.    Hand: Left hand with mildly erythematous dip without fluctuance.  Normal rom.  Nodular swelling of the dip throughout upper extremity digits.  Normal rom.  Sensation intact.  Neurological: She is alert and oriented to person, place, and time.  Skin: She is not diaphoretic.  Psychiatric: She has a normal mood and affect. Her behavior is normal.      Assessment and Plan: Monica Gutierrez is a 60 y.o. female who is here today for cc of back pain. Advised ice and tylenol use as needed. I am referring her back to an orthopedist at this time.  Referral sent for [facility].   Blood pressure refill for 6 months. Discussed labs from annual physical exam.  Will cut out the rice as much as possible.  Will recheck hemoglobin in 6-12 months.  Will start fish oil supplement daily.  Recheck lipid panel in 3-6 months. Left low back pain, with sciatica presence unspecified - Plan: Ambulatory referral to Orthopedic Surgery, Sedimentation Rate  Lumbar radiculopathy - Plan: Ambulatory referral to Orthopedic Surgery, Sedimentation Rate, CANCELED: AMB referral to orthopedics  Hand joint pain, left - Plan: Ambulatory referral to Orthopedic Surgery, Sedimentation Rate  Essential hypertension - Plan: hydrochlorothiazide (HYDRODIURIL) 25 MG tablet   Ivar Drape, PA-C [facility] 09/16/2015 8:30 AM

## 2015-10-24 ENCOUNTER — Ambulatory Visit: Payer: BLUE CROSS/BLUE SHIELD

## 2015-11-04 ENCOUNTER — Encounter: Payer: Self-pay | Admitting: Physician Assistant

## 2015-11-04 ENCOUNTER — Ambulatory Visit (INDEPENDENT_AMBULATORY_CARE_PROVIDER_SITE_OTHER): Payer: BLUE CROSS/BLUE SHIELD | Admitting: Physician Assistant

## 2015-11-04 VITALS — BP 102/74 | HR 87 | Temp 98.0°F | Resp 17 | Ht 60.0 in | Wt 114.0 lb

## 2015-11-04 DIAGNOSIS — G245 Blepharospasm: Secondary | ICD-10-CM | POA: Diagnosis not present

## 2015-11-04 DIAGNOSIS — K623 Rectal prolapse: Secondary | ICD-10-CM | POA: Insufficient documentation

## 2015-11-04 LAB — BASIC METABOLIC PANEL
BUN: 23 mg/dL (ref 7–25)
CALCIUM: 9.7 mg/dL (ref 8.6–10.4)
CO2: 31 mmol/L (ref 20–31)
CREATININE: 0.81 mg/dL (ref 0.50–1.05)
Chloride: 100 mmol/L (ref 98–110)
GLUCOSE: 109 mg/dL — AB (ref 65–99)
POTASSIUM: 4.6 mmol/L (ref 3.5–5.3)
Sodium: 141 mmol/L (ref 135–146)

## 2015-11-04 MED ORDER — TIZANIDINE HCL 2 MG PO TABS: 2.0000 mg | ORAL_TABLET | Freq: Three times a day (TID) | ORAL | Status: DC | PRN

## 2015-11-04 MED ORDER — TIZANIDINE HCL 2 MG PO CAPS: 2.0000 mg | ORAL_CAPSULE | Freq: Three times a day (TID) | ORAL | 0 refills | Status: DC | PRN

## 2015-11-04 NOTE — Patient Instructions (Addendum)
Blepharospasm Blepharospasm is a sudden tightening (spasm) of the muscles around your eyes (orbicularis oculi). It causes attacks of abnormal and uncontrollable blinking that come and go without warning. This type of abnormal muscle movement is called dystonia. Dystonia usually affects both eyes, but it does not affect other facial muscles or other parts of the body. Blepharospasm does not cause vision loss or lead to other serious physical problems. CAUSES Blepharospasm is caused by an abnormality in a part of your brain that is called the basal ganglion. The exact cause of the abnormality is not known. Stress, fatigue, eye irritation, or bright light may trigger attacks of blepharospasm. RISK FACTORS You may be at greater risk for blepharospasm if you:  Have a family history of blepharospasm.  Are female.  Are 73-34 years old. SIGNS AND SYMPTOMS  Eye irritation and dryness are the first symptoms of blepharospasm. Your eyes may also feel tired or irritated when they are exposed to bright lights. As blepharospasm gets worse, uncontrollable blinking becomes more frequent. Other eye symptoms may include:  Uncontrolled and tight closing.  Watering.  Dryness.  Eye muscle pain.  Gritty sensation.  Sensitivity to light. DIAGNOSIS Your health care provider may diagnose blepharospasm based on your symptoms and your medical history. Your health care provider may also do a physical exam to confirm the diagnosis. There are no blood tests or other tests to help diagnose blepharospasm. TREATMENT There is no cure for blepharospasm. Treatments that are used to manage the condition may include:  Botulinum toxin injections. Botulinum toxin is a substance that is produced by bacteria that cause muscle paralysis. Injecting this toxin into the muscles around the eye may control blepharospasm for up to three months. Injections are done with a tiny needle. They can be repeated as needed.  Medicines.  These include muscle relaxants, sedatives, and medicines that are called anticholinergics. Treatment with medicine is less successful than using injections.  Surgery. If other treatments have not worked, you may need surgery to remove part of the orbicularis oculi muscles (myectomy). HOME CARE INSTRUCTIONS Several home care management strategies may help. You may have to try different techniques to find what works best for you. These may include:  Avoiding triggers that may bring on your attacks.  Resting and avoiding stress as much as possible.  Applying gentle pressure to the side of your eye or face. Sometimes this can stop an attack.  Doing a specific activity that can halt an attack. Examples include laughing, singing, yawning, and chewing.  Wearing dark glasses and a sun visor to protect your eyes from bright light.  Trying alternative treatments such as acupuncture, biofeedback, hypnosis, or meditation.  Going to sleep or taking a nap. Blepharospasm usually stops during sleep. SEEK MEDICAL CARE IF:  Your attacks get worse or happen more often.  You are anxious or depressed because of your attacks.   This information is not intended to replace advice given to you by your health care provider. Make sure you discuss any questions you have with your health care provider.   Document Released: 03/25/2003 Document Revised: 12/11/2014 Document Reviewed: 11/13/2013 Elsevier Interactive Patient Education 2016 [employer].    IF you received an x-ray today, you will receive an invoice from [facility]. Please contact [facility] at [phone] with questions or concerns regarding your invoice.   IF you received labwork today, you will receive an invoice from [employer]. Please contact [facility] at [phone] with questions or concerns regarding your  invoice.   Our billing staff will not be able to assist you with questions regarding  bills from these companies.  You will be contacted with the lab results as soon as they are available. The fastest way to get your results is to activate your My Chart account. Instructions are located on the last page of this paperwork. If you have not heard from [place] regarding the results in 2 weeks, please contact this office.

## 2015-11-04 NOTE — Progress Notes (Signed)
Patient ID: SKYLEY [place], female   DOB: December 23, 1955, 60 y.o.   MRN: [id number] [facility] [address], [place] [address] [phone]  Date:  11/04/2015   Name:  Monica Gutierrez   DOB:  [birth year]   MRN:  [id number]  PCP:  Darci Needle, MD   By signing my name below, I, Ladene Artist, attest that this documentation has been prepared under the direction and in the presence of Ivar Drape, PA-C Electronically Signed: Ladene Artist, ED Scribe 11/04/2015 at 5:08 PM.  History of Present Illness:  Monica Gutierrez is a 60 y.o. female patient who presents to [facility] complaining of intermittent left eye twitching that has worsened over the past 2 months. Pt reports occasional pinching and electric shock sensations in her right occipital head; states that she last experienced this sensation a few days ago while lying down. No treatments tried PTA. She denies double vision, eye pain, rash, facial numbness.  Patient Active Problem List   Diagnosis Date Noted   Rectal mucosa prolapse 11/04/2015   Essential hypertension, benign 02/16/2012   Colon polyps 02/16/2012    Past Medical History:  Diagnosis Date   Hypertension     Past Surgical History:  Procedure Laterality Date   CESAREAN SECTION     COLON SURGERY  2010   5 polyps removed 2 years ago   TUBAL LIGATION      Social History  Substance Use Topics   Smoking status: Light Tobacco Smoker    Packs/day: 0.25    Years: 15.00    Types: Cigarettes   Smokeless tobacco: Not on file   Alcohol use Yes     Comment: one beer on the holidays    No family history on file.  No Known Allergies  Medication list has been reviewed and updated.  Current Outpatient Prescriptions on File Prior to Visit  Medication Sig Dispense Refill   Boswellia-Glucosamine-Vit D (GLUCOSAMINE COMPLEX PO) Take 1 capsule by mouth daily. Reported on 09/10/2015     Calcium Carb-Cholecalciferol (CALCIUM + D3 PO) Take by mouth.      hydrochlorothiazide (HYDRODIURIL) 25 MG tablet Take 1 tablet (25 mg total) by mouth daily. 30 tablet 5   LORATADINE PO Take by mouth.     OVER THE COUNTER MEDICATION Take 1 tablet by mouth daily. Allergy tablet, pink, $ over the counter at [place] Name: Zakuror balls     No current facility-administered medications on file prior to visit.     Review of Systems  Eyes: Negative for double vision and pain.  Skin: Negative for rash.    Physical Examination: BP 102/74 (BP Location: Right Arm, Patient Position: Sitting, Cuff Size: Normal)    Pulse 87    Temp 98 F (36.7 C) (Oral)    Resp 17    Ht 5' (1.524 m)    Wt 114 lb (51.7 kg)    SpO2 97%    BMI 22.26 kg/m  Ideal Body Weight: @FLOWAMB [id number]  Physical Exam  Constitutional: She is oriented to person, place, and time. She appears well-developed and well-nourished. No distress.  HENT:  Head: Normocephalic and atraumatic.  Right Ear: Tympanic membrane, external ear and ear canal normal.  Left Ear: Tympanic membrane, external ear and ear canal normal.  Eyes: Conjunctivae and EOM are normal. Pupils are equal, round, and reactive to light.  Very minimal twitching of the L upper eyelid.  Cardiovascular: Normal rate and regular rhythm.  Exam reveals no gallop and no friction rub.   No murmur heard. Pulmonary/Chest: Effort normal and breath sounds normal. No respiratory distress. She has no decreased breath sounds. She has no wheezes. She has no rhonchi. She has no rales.  Neurological: She is alert and oriented to person, place, and time.  Skin: She is not diaphoretic.  Psychiatric: She has a normal mood and affect. Her behavior is normal.    Assessment and Plan: Monica Gutierrez is a 60 y.o. female who is here today or cc of blepharospasm. Advised metabolic panel, and will use muscle relaxant also recommended thermotherapy.  Will increase rest, and contact as needed.  Blepharospasm - Plan: Basic  metabolic panel, DISCONTINUED: tiZANidine (ZANAFLEX) tablet 2 mg  Ivar Drape, PA-C [facility] 11/04/2015 3:52 PM

## 2015-11-30 ENCOUNTER — Encounter: Payer: Self-pay | Admitting: Physician Assistant

## 2015-12-15 ENCOUNTER — Ambulatory Visit (INDEPENDENT_AMBULATORY_CARE_PROVIDER_SITE_OTHER): Payer: BLUE CROSS/BLUE SHIELD | Admitting: Family Medicine

## 2015-12-15 VITALS — BP 120/70 | HR 74 | Temp 98.3°F | Resp 18 | Ht 60.0 in | Wt 116.2 lb

## 2015-12-15 DIAGNOSIS — K2100 Gastro-esophageal reflux disease with esophagitis, without bleeding: Secondary | ICD-10-CM

## 2015-12-15 DIAGNOSIS — K635 Polyp of colon: Secondary | ICD-10-CM

## 2015-12-15 MED ORDER — OMEPRAZOLE 40 MG PO CPDR: 40.0000 mg | DELAYED_RELEASE_CAPSULE | Freq: Every day | ORAL | 1 refills | Status: DC

## 2015-12-15 NOTE — Progress Notes (Signed)
Patient ID: Monica Gutierrez, female    DOB: 1955-09-03, 60 y.o.   MRN: [id number]  PCP: Darci Needle, MD  Chief Complaint  Patient presents with  . Gastroesophageal Reflux    Pt. feels a burning sensation in stomach. Also feels nauseous.     Subjective:   HPI 60 year old presents for evaluation of abdominal pain.  Patient reports nausea after eat, and when her stomach is empty. She has reduced intake of acid foods such as fruit in hopes of improvement of symptoms.  Experiencing bloating, burping, and sensation of reflux of stomach contents in thoart. Occasional cough with lying down and experiences heartburn. Awakens at night and gets up and drink water to relieve burning.  She has taken Zantac once per day and feels this has been helpful. No diarrhea. Regular stools everyday. Reports being told in the past that she has colon pylops but she has not followed for a repeat colonoscopy.  Social History   Social History  . Marital status: Married    Spouse name: N/A  . Number of children: N/A  . Years of education: N/A   Occupational History  . Not on file.   Social History Main Topics  . Smoking status: Light Tobacco Smoker    Packs/day: 0.25    Years: 15.00    Types: Cigarettes  . Smokeless tobacco: Not on file  . Alcohol use Yes     Comment: one beer on the holidays  . Drug use: No  . Sexual activity: Yes    Birth control/ protection: Surgical   Other Topics Concern  . Not on file   Social History Narrative   Lives in [place] with husband.  Children are 21, 19, and 14.  All live at home.   No family history on file.  Review of Systems See HPI   Patient Active Problem List   Diagnosis Date Noted  . Rectal mucosa prolapse 11/04/2015  . Essential hypertension, benign 02/16/2012  . Colon polyps 02/16/2012     Prior to Admission medications   Medication Sig Start Date End Date Taking? Authorizing Provider  Boswellia-Glucosamine-Vit D (GLUCOSAMINE  COMPLEX PO) Take 1 capsule by mouth daily. Reported on 09/10/2015   Yes Historical Provider, MD  Calcium Carb-Cholecalciferol (CALCIUM + D3 PO) Take by mouth.   Yes Historical Provider, MD  hydrochlorothiazide (HYDRODIURIL) 25 MG tablet Take 1 tablet (25 mg total) by mouth daily. 09/16/15  Yes Stephanie D English, PA  LORATADINE PO Take by mouth.   Yes Historical Provider, MD  OVER THE COUNTER MEDICATION Take 1 tablet by mouth daily. Allergy tablet, pink, $ over the counter at [facility]   Yes Historical Provider, MD  tizanidine (ZANAFLEX) 2 MG capsule Take 1 capsule (2 mg total) by mouth 3 (three) times daily as needed for muscle spasms. 11/04/15  Yes Dorian Heckle English, PA  UNABLE TO FIND Med Name: Zakuror balls   Yes Historical Provider, MD     No Known Allergies     Objective:  Physical Exam  Constitutional: She is oriented to person, place, and time. She appears well-developed and well-nourished.  HENT:  Head: Normocephalic and atraumatic.  Right Ear: External ear normal.  Left Ear: External ear normal.  Nose: Nose normal.  Mouth/Throat: Oropharynx is clear and moist.  Eyes: Conjunctivae and EOM are normal. Pupils are equal, round, and reactive to light.  Neck: Normal range of motion. Neck supple.  Pulmonary/Chest: Effort normal and breath sounds normal.  Abdominal: Soft. Bowel sounds are normal. She exhibits no mass. There is no tenderness. There is no rebound and no guarding.  Musculoskeletal: Normal range of motion.  Neurological: She is alert and oriented to person, place, and time.  Skin: Skin is warm and dry.  Psychiatric: She has a normal mood and affect. Her behavior is normal. Judgment and thought content normal.     . Vitals:   12/15/15 1049  BP: 120/70  Pulse: 74  Resp: 18  Temp: 98.3 F (36.8 C)   Assessment & Plan:  1. Gastroesophageal reflux disease with esophagitis, patient reports improvement with acid inhibitor.  Plan: -Start PPI omeprazole 40 mg, once  daily -Return if symptoms do not improve   2. Colon polyp, history colon polyp.  Patient has not followed up with Gastroenterology for repeat colonoscopy.  Plan:  - Ambulatory referral to Gastroenterology   Carroll Sage. Kenton Kingfisher, MSN, FNP-C [facility]

## 2015-12-15 NOTE — Patient Instructions (Addendum)
Gastroenterology will follow-up with you to schedule an appointment.  Start Omperazole 40 mg once daily for reflux symptoms.  IF you received an x-ray today, you will receive an invoice from [facility]. Please contact [facility] at [phone] with questions or concerns regarding your invoice.   IF you received labwork today, you will receive an invoice from [employer]. Please contact [facility] at [phone] with questions or concerns regarding your invoice.   Our billing staff will not be able to assist you with questions regarding bills from these companies.  You will be contacted with the lab results as soon as they are available. The fastest way to get your results is to activate your My Chart account. Instructions are located on the last page of this paperwork. If you have not heard from [place] regarding the results in 2 weeks, please contact this office.    Food Choices for Gastroesophageal Reflux Disease, Adult When you have gastroesophageal reflux disease (GERD), the foods you eat and your eating habits are very important. Choosing the right foods can help ease the discomfort of GERD. WHAT GENERAL GUIDELINES DO I NEED TO FOLLOW?  Choose fruits, vegetables, whole grains, low-fat dairy products, and low-fat meat, fish, and poultry.  Limit fats such as oils, salad dressings, butter, nuts, and avocado.  Keep a food diary to identify foods that cause symptoms.  Avoid foods that cause reflux. These may be different for different people.  Eat frequent small meals instead of three large meals each day.  Eat your meals slowly, in a relaxed setting.  Limit fried foods.  Cook foods using methods other than frying.  Avoid drinking alcohol.  Avoid drinking large amounts of liquids with your meals.  Avoid bending over or lying down until 2-3 hours after eating. WHAT FOODS ARE NOT RECOMMENDED? The following are some foods and drinks that  may worsen your symptoms: Vegetables Tomatoes. Tomato juice. Tomato and spaghetti sauce. Chili peppers. Onion and garlic. Horseradish. Fruits Oranges, grapefruit, and lemon (fruit and juice). Meats High-fat meats, fish, and poultry. This includes hot dogs, ribs, ham, sausage, salami, and bacon. Dairy Whole milk and chocolate milk. Sour cream. Cream. Butter. Ice cream. Cream cheese.  Beverages Coffee and tea, with or without caffeine. Carbonated beverages or energy drinks. Condiments Hot sauce. Barbecue sauce.  Sweets/Desserts Chocolate and cocoa. Donuts. Peppermint and spearmint. Fats and Oils High-fat foods, including [place] fries and potato chips. Other Vinegar. Strong spices, such as black pepper, white pepper, red pepper, cayenne, curry powder, cloves, ginger, and chili powder. The items listed above may not be a complete list of foods and beverages to avoid. Contact your dietitian for more information.   This information is not intended to replace advice given to you by your health care provider. Make sure you discuss any questions you have with your health care provider.   Document Released: 03/22/2005 Document Revised: 04/12/2014 Document Reviewed: 01/24/2013 Elsevier Interactive Patient Education [employer].

## 2016-05-10 ENCOUNTER — Encounter: Payer: Self-pay | Admitting: Family Medicine

## 2016-05-18 ENCOUNTER — Ambulatory Visit: Payer: BLUE CROSS/BLUE SHIELD | Admitting: Physician Assistant

## 2016-05-19 ENCOUNTER — Ambulatory Visit (INDEPENDENT_AMBULATORY_CARE_PROVIDER_SITE_OTHER): Payer: BLUE CROSS/BLUE SHIELD | Admitting: Physician Assistant

## 2016-05-19 ENCOUNTER — Telehealth: Payer: Self-pay

## 2016-05-19 VITALS — BP 90/60 | HR 88 | Temp 98.7°F | Resp 16 | Ht 60.5 in | Wt 112.2 lb

## 2016-05-19 DIAGNOSIS — I10 Essential (primary) hypertension: Secondary | ICD-10-CM | POA: Diagnosis not present

## 2016-05-19 DIAGNOSIS — R7303 Prediabetes: Secondary | ICD-10-CM

## 2016-05-19 DIAGNOSIS — E785 Hyperlipidemia, unspecified: Secondary | ICD-10-CM

## 2016-05-19 MED ORDER — HYDROCHLOROTHIAZIDE 25 MG PO TABS: 25.0000 mg | ORAL_TABLET | Freq: Every day | ORAL | 11 refills | Status: DC

## 2016-05-19 NOTE — Telephone Encounter (Signed)
In englishs' box

## 2016-05-19 NOTE — Progress Notes (Signed)
[facility] [address] [place] [address] [phone]  Date:  05/19/2016   Name:  Monica Gutierrez   DOB:  1956-02-20   MRN:  [id number]  PCP:  No primary care provider on file.    History of Present Illness:  Monica Gutierrez is a 61 y.o. female patient who presents to [facility] for dizziness and medication refill.     She is taking one hctz tablet per day. She has dizziness, once in a while.  No tremulousness .  She will at times feel like "an empty can".  She will eat a cheeseburger and it will return to normal.  She was having a cough for 3 weeks ago.  This has since resolved, but she states that she lost weight.  She is intentionally eating smaller portion and now eating milk, blueberry and orange.  She is trying to avoid diabetes.  Borderline 5.9.    Compliant with taking bp medication.  She is regimented with this.  No cp, dizziness, or palpitaitons.  No black or bloody stools.  No sob or dyspnea.  She generally has 1-2 cigarettes per day for years. She does report moments where she feels very lonely.  She feels it in the pit of her stomach.  She states that she used to have her children at home but they are at college, or out of state.  Husband works 9-9pm.  She copes with looking in the mirror, and saying you will be ok, you are doing fine.  She will go walking, or bowling.   Wt Readings from Last 3 Encounters:  05/19/16 112 lb 3.2 oz (50.9 kg)  12/15/15 116 lb 3.2 oz (52.7 kg)  11/04/15 114 lb (51.7 kg)      Patient Active Problem List   Diagnosis Date Noted  . Rectal mucosa prolapse 11/04/2015  . Essential hypertension, benign 02/16/2012  . Colon polyps 02/16/2012    Past Medical History:  Diagnosis Date  . Hypertension     Past Surgical History:  Procedure Laterality Date  . CESAREAN SECTION    . COLON SURGERY  2010   5 polyps removed 2 years ago  . TUBAL LIGATION      Social History  Substance Use Topics  . Smoking status: Former Smoker   Packs/day: 0.25    Years: 15.00    Types: Cigarettes  . Smokeless tobacco: Never Used  . Alcohol use Yes     Comment: one beer on the holidays    No family history on file.  No Known Allergies  Medication list has been reviewed and updated.  Current Outpatient Prescriptions on File Prior to Visit  Medication Sig Dispense Refill  . Boswellia-Glucosamine-Vit D (GLUCOSAMINE COMPLEX PO) Take 1 capsule by mouth daily. Reported on 09/10/2015    . Calcium Carb-Cholecalciferol (CALCIUM + D3 PO) Take by mouth.    . hydrochlorothiazide (HYDRODIURIL) 25 MG tablet Take 1 tablet (25 mg total) by mouth daily. 30 tablet 5  . LORATADINE PO Take by mouth.    Marland Kitchen omeprazole (PRILOSEC) 40 MG capsule Take 1 capsule (40 mg total) by mouth daily. 30 capsule 1  . OVER THE COUNTER MEDICATION Take 1 tablet by mouth daily. Allergy tablet, pink, $ over the counter at [facility]    . tizanidine (ZANAFLEX) 2 MG capsule Take 1 capsule (2 mg total) by mouth 3 (three) times daily as needed for muscle spasms. 20 capsule 0  . UNABLE TO FIND Med  Name: Zakuror balls     No current facility-administered medications on file prior to visit.     ROS ROS otherwise unremarkable unless listed above.   Physical Examination: BP 90/60   Pulse 88   Temp 98.7 F (37.1 C) (Oral)   Resp 16   Ht 5' 0.5" (1.537 m)   Wt 112 lb 3.2 oz (50.9 kg)   SpO2 97%   BMI 21.55 kg/m  Ideal Body Weight: Weight in (lb) to have BMI = 25: 129.9 Wt Readings from Last 3 Encounters:  05/19/16 112 lb 3.2 oz (50.9 kg)  12/15/15 116 lb 3.2 oz (52.7 kg)  11/04/15 114 lb (51.7 kg)    Physical Exam  Constitutional: She is oriented to person, place, and time. She appears well-developed and well-nourished. No distress.  HENT:  Head: Normocephalic and atraumatic.  Right Ear: External ear normal.  Left Ear: External ear normal.  Eyes: Conjunctivae and EOM are normal. Pupils are equal, round, and reactive to light.  Eyelid inverted with good  perfusion.  Cardiovascular: Normal rate, regular rhythm and intact distal pulses.  Exam reveals no friction rub.   No murmur heard. Pulmonary/Chest: Effort normal. No respiratory distress. She has no wheezes.  Neurological: She is alert and oriented to person, place, and time.  Skin: Capillary refill takes less than 2 seconds. She is not diaphoretic.  Psychiatric: She has a normal mood and affect. Her behavior is normal.    BP Readings from Last 3 Encounters:  05/19/16 90/60  12/15/15 120/70  11/04/15 102/74    Assessment and Plan: Monica Gutierrez is a 61 y.o. female who is here today for dizziness. I have advised that there is a concern that hctz may not be needed at this time.  She reports that she did not eat prior to this visit.  And had not eaten prior to her her lightheadedness.  I have advised to monitor her bp daily.  If systolic under [id number], she was advised to return immediately and dc anti-hypertensive where we will work up low bp.   She will not take her hctz today.  She states that she was removed from taking her bp medication but htn came back and placed back on.     Will recheck a1c and lipid panel.  Borderline diabetes - Plan: Lipid panel, Hemoglobin A1c  Hyperlipidemia, unspecified hyperlipidemia type - Plan: Lipid panel  Essential hypertension - Plan: hydrochlorothiazide (HYDRODIURIL) 25 MG tablet  Ivar Drape, PA-C [facility] 2/21/20189:09 AM

## 2016-05-19 NOTE — Telephone Encounter (Signed)
Pt dropped off colonoscopy results for English to review.  Paperwork was placed in nurses box at 102  [phone]

## 2016-05-19 NOTE — Patient Instructions (Addendum)
  I would like you to check your blood pressure three times per week.  If your blood pressure top number is below 100, I need you to let me know.  We will then try splitting your pill in half.  You do not want it too low, because you run the risk of fainting, or falling.   IF you received an x-ray today, you will receive an invoice from [facility]. Please contact [facility] at [phone] with questions or concerns regarding your invoice.   IF you received labwork today, you will receive an invoice from [facility]. Please contact [facility] at [phone] with questions or concerns regarding your invoice.   Our billing staff will not be able to assist you with questions regarding bills from these companies.  You will be contacted with the lab results as soon as they are available. The fastest way to get your results is to activate your My Chart account. Instructions are located on the last page of this paperwork. If you have not heard from [place] regarding the results in 2 weeks, please contact this office.

## 2016-05-20 LAB — LIPID PANEL
Chol/HDL Ratio: 5.4 ratio units — ABNORMAL HIGH (ref 0.0–4.4)
Cholesterol, Total: 279 mg/dL — ABNORMAL HIGH (ref 100–199)
HDL: 52 mg/dL (ref >39)
LDL Calculated: 168 mg/dL — ABNORMAL HIGH (ref 0–99)
TRIGLYCERIDES: 293 mg/dL — AB (ref 0–149)
VLDL CHOLESTEROL CAL: 59 mg/dL — AB (ref 5–40)

## 2016-05-20 LAB — HEMOGLOBIN A1C
Est. average glucose Bld gHb Est-mCnc: 120 mg/dL
Hgb A1c MFr Bld: 5.8 % — ABNORMAL HIGH (ref 4.8–5.6)

## 2016-07-06 ENCOUNTER — Other Ambulatory Visit: Payer: Self-pay | Admitting: Physician Assistant

## 2016-07-06 ENCOUNTER — Ambulatory Visit: Payer: BLUE CROSS/BLUE SHIELD

## 2016-07-06 DIAGNOSIS — E785 Hyperlipidemia, unspecified: Secondary | ICD-10-CM

## 2016-07-06 MED ORDER — ATORVASTATIN CALCIUM 40 MG PO TABS: 40.0000 mg | ORAL_TABLET | Freq: Every day | ORAL | 3 refills | Status: DC

## 2016-08-04 ENCOUNTER — Ambulatory Visit (INDEPENDENT_AMBULATORY_CARE_PROVIDER_SITE_OTHER): Payer: BLUE CROSS/BLUE SHIELD | Admitting: Physician Assistant

## 2016-08-04 ENCOUNTER — Encounter: Payer: Self-pay | Admitting: Physician Assistant

## 2016-08-04 VITALS — BP 115/71 | HR 85 | Temp 98.6°F | Resp 20 | Ht 60.5 in | Wt 116.6 lb

## 2016-08-04 DIAGNOSIS — R109 Unspecified abdominal pain: Secondary | ICD-10-CM

## 2016-08-04 DIAGNOSIS — M7989 Other specified soft tissue disorders: Secondary | ICD-10-CM

## 2016-08-04 DIAGNOSIS — M25541 Pain in joints of right hand: Secondary | ICD-10-CM

## 2016-08-04 DIAGNOSIS — M25542 Pain in joints of left hand: Secondary | ICD-10-CM

## 2016-08-04 LAB — POCT URINALYSIS DIP (MANUAL ENTRY)
Bilirubin, UA: NEGATIVE
Blood, UA: NEGATIVE
Glucose, UA: NEGATIVE mg/dL
Ketones, POC UA: NEGATIVE mg/dL
Leukocytes, UA: NEGATIVE
NITRITE UA: NEGATIVE
PH UA: 7 (ref 5.0–8.0)
Protein Ur, POC: NEGATIVE mg/dL
SPEC GRAV UA: 1.015 (ref 1.010–1.025)
Urobilinogen, UA: 0.2 E.U./dL

## 2016-08-04 MED ORDER — OMEPRAZOLE 20 MG PO CPDR: 20.0000 mg | DELAYED_RELEASE_CAPSULE | Freq: Every day | ORAL | 0 refills | Status: DC

## 2016-08-04 MED ORDER — MELOXICAM 7.5 MG PO TABS: 7.5000 mg | ORAL_TABLET | Freq: Every day | ORAL | 0 refills | Status: DC

## 2016-08-04 NOTE — Progress Notes (Signed)
PRIMARY CARE AT [facility] [address] [place] [address] [phone]  Date:  08/04/2016   Name:  Monica Gutierrez   DOB:  1955-05-27   MRN:  [id number]  PCP:  Ivar Drape, PA    History of Present Illness:  Monica Gutierrez is a 61 y.o. female patient who presents to PCP with  Chief Complaint  Patient presents with  . Abdominal Pain    on left side     Patient is here today for hand pain, and abdominal pain.  Patient states that the abdominal pain has been for months.  She has a pinching sensation on her side when she is sitting.  Standing or laying down will help.  It is not a palpable pain.  May happen for a few seconds, and then resolve.   Not associated with food.  No blood in stool, or black stool.  No dysuria, hematuria, or frequency.  No sob. She also has complaint of hand swelling.  She has seen this for more than a year.  She has a "feeling like it is swollen", though she is not positive that there is selling of her hand.  Joint pain and swelling.   Feels like her hands are tight and she can not make a grip.  She has noticed no redness or swelling.  She has attempted to sleep with her hands over her head, and wearing gloves which she says is not helping.  Feels like needles.  She is not performing repetitive movements.  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  She does not wear rings, as her joint swelling prevents her from getting them on for years.  Patient Active Problem List   Diagnosis Date Noted  . Rectal mucosa prolapse 11/04/2015  . Essential hypertension, benign 02/16/2012  . Colon polyps 02/16/2012    Past Medical History:  Diagnosis Date  . Hypertension     Past Surgical History:  Procedure Laterality Date  . CESAREAN SECTION    . COLON SURGERY  2010   5 polyps removed 2 years ago  . TUBAL LIGATION      Social History  Substance Use Topics  . Smoking status: Former Smoker    Packs/day: 0.25    Years: 15.00    Types: Cigarettes  . Smokeless  tobacco: Never Used  . Alcohol use 0.6 oz/week    1 Cans of beer per week     Comment: one beer on the holidays    History reviewed. No pertinent family history.  No Known Allergies  Medication list has been reviewed and updated.  Current Outpatient Prescriptions on File Prior to Visit  Medication Sig Dispense Refill  . atorvastatin (LIPITOR) 40 MG tablet Take 1 tablet (40 mg total) by mouth daily. 90 tablet 3  . Boswellia-Glucosamine-Vit D (GLUCOSAMINE COMPLEX PO) Take 1 capsule by mouth daily. Reported on 09/10/2015    . Calcium Carb-Cholecalciferol (CALCIUM + D3 PO) Take by mouth.    . hydrochlorothiazide (HYDRODIURIL) 25 MG tablet Take 1 tablet (25 mg total) by mouth daily. 30 tablet 11  . LORATADINE PO Take by mouth.    Marland Kitchen omeprazole (PRILOSEC) 40 MG capsule Take 1 capsule (40 mg total) by mouth daily. 30 capsule 1  . OVER THE COUNTER MEDICATION Take 1 tablet by mouth daily. Allergy tablet, pink, $ over the counter at [facility]    . tizanidine (ZANAFLEX) 2 MG capsule Take 1 capsule (2 mg total) by mouth 3 (three)  times daily as needed for muscle spasms. 20 capsule 0  . UNABLE TO FIND Med Name: Zakuror balls     No current facility-administered medications on file prior to visit.     ROS ROS otherwise unremarkable unless listed above.  Physical Examination: BP 115/71 (BP Location: Right Arm, Patient Position: Sitting, Cuff Size: Small)   Pulse 85   Temp 98.6 F (37 C) (Oral)   Resp 20   Ht 5' 0.5" (1.537 m)   Wt 116 lb 9.6 oz (52.9 kg)   SpO2 96%   BMI 22.40 kg/m  Ideal Body Weight: Weight in (lb) to have BMI = 25: 129.9  Physical Exam  Constitutional: She is oriented to person, place, and time. She appears well-developed and well-nourished. No distress.  HENT:  Head: Normocephalic and atraumatic.  Right Ear: External ear normal.  Left Ear: External ear normal.  Eyes: Conjunctivae and EOM are normal. Pupils are equal, round, and reactive to light.  Cardiovascular:  Normal rate.   Pulmonary/Chest: Effort normal. No respiratory distress.  Abdominal: Soft. Normal appearance and bowel sounds are normal. There is no hepatosplenomegaly. There is no tenderness.  Musculoskeletal:  Hands with joint nodularity at the distal joint of the digits without any specific deviation.  No erythema or swelling of the hand.  5/5 strength throughout the extremity. Negative phalen's bilaterally Negative tinels bilaterally  Neurological: She is alert and oriented to person, place, and time.  Skin: She is not diaphoretic.  Psychiatric: She has a normal mood and affect. Her behavior is normal.    Results for orders placed or performed in visit on 08/04/16  POCT urinalysis dipstick  Result Value Ref Range   Color, UA yellow yellow   Clarity, UA clear clear   Glucose, UA negative negative mg/dL   Bilirubin, UA negative negative   Ketones, POC UA negative negative mg/dL   Spec Grav, UA 1.015 1.010 - 1.025   Blood, UA negative negative   pH, UA 7.0 5.0 - 8.0   Protein Ur, POC negative negative mg/dL   Urobilinogen, UA 0.2 0.2 or 1.0 E.U./dL   Nitrite, UA Negative Negative   Leukocytes, UA Negative Negative     Assessment and Plan: Monica Gutierrez is a 61 y.o. female who is here today for cc of hand pain and abdominal pain. This appears to be pinching of the skin of the abdominal fat together.   Hand pain likely arthritis.  Advised aspirin daily, and giving her a small anti-inflammatory temporarily.  Advised to take with omeprazole.   Rheumatoid factor obtained.  Arthralgia of both hands - Plan: VITAMIN D 25 Hydroxy (Vit-D Deficiency, Fractures), omeprazole (PRILOSEC) 20 MG capsule, meloxicam (MOBIC) 7.5 MG tablet  Swelling of both hands - Plan: Sedimentation Rate, Rheumatoid factor, POCT urinalysis dipstick, VITAMIN D 25 Hydroxy (Vit-D Deficiency, Fractures)  Abdominal pain, unspecified abdominal location  Ivar Drape, PA-C [facility] 5/4/20188:33 AM

## 2016-08-04 NOTE — Patient Instructions (Addendum)
I am checking your vitamin D at this time. I would like you to start taking 81mg  aspirin daily at this time.  You can also take tylenol for your pain.  I will contact you with the result of your blood work.     Osteoarthritis Osteoarthritis is a type of arthritis that affects tissue that covers the ends of bones in joints (cartilage). Cartilage acts as a cushion between the bones and helps them move smoothly. Osteoarthritis results when cartilage in the joints gets worn down. Osteoarthritis is sometimes called "wear and tear" arthritis. Osteoarthritis is the most common form of arthritis. It often occurs in older people. It is a condition that gets worse over time (a progressive condition). Joints that are most often affected by this condition are in:  Fingers.  Toes.  Hips.  Knees.  Spine, including neck and lower back. What are the causes? This condition is caused by age-related wearing down of cartilage that covers the ends of bones. What increases the risk? The following factors may make you more likely to develop this condition:  Older age.  Being overweight or obese.  Overuse of joints, such as in athletes.  Past injury of a joint.  Past surgery on a joint.  Family history of osteoarthritis. What are the signs or symptoms? The main symptoms of this condition are pain, swelling, and stiffness in the joint. The joint may lose its shape over time. Small pieces of bone or cartilage may break off and float inside of the joint, which may cause more pain and damage to the joint. Small deposits of bone (osteophytes) may grow on the edges of the joint. Other symptoms may include:  A grating or scraping feeling inside the joint when you move it.  Popping or creaking sounds when you move. Symptoms may affect one or more joints. Osteoarthritis in a major joint, such as your knee or hip, can make it painful to walk or exercise. If you have osteoarthritis in your hands, you might not  be able to grip items, twist your hand, or control small movements of your hands and fingers (fine motor skills). How is this diagnosed? This condition may be diagnosed based on:  Your medical history.  A physical exam.  Your symptoms.  X-rays of the affected joint(s).  Blood tests to rule out other types of arthritis. How is this treated? There is no cure for this condition, but treatment can help to control pain and improve joint function. Treatment plans may include:  A prescribed exercise program that allows for rest and joint relief. You may work with a physical therapist.  A weight control plan.  Pain relief techniques, such as:  Applying heat and cold to the joint.  Electric pulses delivered to nerve endings under the skin (transcutaneous electrical nerve stimulation, or TENS).  Massage.  Certain nutritional supplements.  NSAIDs or prescription medicines to help relieve pain.  Medicine to help relieve pain and inflammation (corticosteroids). This can be given by mouth (orally) or as an injection.  Assistive devices, such as a brace, wrap, splint, specialized glove, or cane.  Surgery, such as:  An osteotomy. This is done to reposition the bones and relieve pain or to remove loose pieces of bone and cartilage.  Joint replacement surgery. You may need this surgery if you have very bad (advanced) osteoarthritis. Follow these instructions at home: Activity   Rest your affected joints as directed by your health care provider.  Do not drive or use heavy  machinery while taking prescription pain medicine.  Exercise as directed. Your health care provider or physical therapist may recommend specific types of exercise, such as:  Strengthening exercises. These are done to strengthen the muscles that support joints that are affected by arthritis. They can be performed with weights or with exercise bands to add resistance.  Aerobic activities. These are exercises, such as  brisk walking or water aerobics, that get your heart pumping.  Range-of-motion activities. These keep your joints easy to move.  Balance and agility exercises. Managing pain, stiffness, and swelling   If directed, apply heat to the affected area as often as told by your health care provider. Use the heat source that your health care provider recommends, such as a moist heat pack or a heating pad.  If you have a removable assistive device, remove it as told by your health care provider.  Place a towel between your skin and the heat source. If your health care provider tells you to keep the assistive device on while you apply heat, place a towel between the assistive device and the heat source.  Leave the heat on for 20-30 minutes.  Remove the heat if your skin turns bright red. This is especially important if you are unable to feel pain, heat, or cold. You may have a greater risk of getting burned.  If directed, put ice on the affected joint:  If you have a removable assistive device, remove it as told by your health care provider.  Put ice in a plastic bag.  Place a towel between your skin and the bag. If your health care provider tells you to keep the assistive device on during icing, place a towel between the assistive device and the bag.  Leave the ice on for 20 minutes, 2-3 times a day. General instructions   Take over-the-counter and prescription medicines only as told by your health care provider.  Maintain a healthy weight. Follow instructions from your health care provider for weight control. These may include dietary restrictions.  Do not use any products that contain nicotine or tobacco, such as cigarettes and e-cigarettes. These can delay bone healing. If you need help quitting, ask your health care provider.  Use assistive devices as directed by your health care provider.  Keep all follow-up visits as told by your health care provider. This is important. Where to find  more information:  [employer] of Arthritis and Musculoskeletal and Skin Diseases: www.niams.SouthExposed.es  [employer] on Aging: http://kim-miller.com/  American College of Rheumatology: www.rheumatology.org Contact a health care provider if:  Your skin turns red.  You develop a rash.  You have pain that gets worse.  You have a fever along with joint or muscle aches. Get help right away if:  You lose a lot of weight.  You suddenly lose your appetite.  You have night sweats. Summary  Osteoarthritis is a type of arthritis that affects tissue covering the ends of bones in joints (cartilage).  This condition is caused by age-related wearing down of cartilage that covers the ends of bones.  The main symptom of this condition is pain, swelling, and stiffness in the joint.  There is no cure for this condition, but treatment can help to control pain and improve joint function. This information is not intended to replace advice given to you by your health care provider. Make sure you discuss any questions you have with your health care provider. Document Released: 03/22/2005 Document Revised: 11/24/2015 Document Reviewed: 11/24/2015 Elsevier  Interactive Patient Education  2017 [employer].   IF you received an x-ray today, you will receive an invoice from [facility]. Please contact [facility] at [phone] with questions or concerns regarding your invoice.   IF you received labwork today, you will receive an invoice from [place]. Please contact [facility] at [phone] with questions or concerns regarding your invoice.   Our billing staff will not be able to assist you with questions regarding bills from these companies.  You will be contacted with the lab results as soon as they are available. The fastest way to get your results is to activate your My Chart account. Instructions are located on the last page of this paperwork. If you have not heard from [place]  regarding the results in 2 weeks, please contact this office.

## 2016-08-05 ENCOUNTER — Other Ambulatory Visit: Payer: Self-pay | Admitting: Physician Assistant

## 2016-08-05 DIAGNOSIS — E559 Vitamin D deficiency, unspecified: Secondary | ICD-10-CM

## 2016-08-05 LAB — RHEUMATOID FACTOR: Rhuematoid fact SerPl-aCnc: <10 IU/mL (ref 0.0–13.9)

## 2016-08-05 LAB — SEDIMENTATION RATE: Sed Rate: 3 mm/hr (ref 0–40)

## 2016-08-05 LAB — VITAMIN D 25 HYDROXY (VIT D DEFICIENCY, FRACTURES): VIT D 25 HYDROXY: 28.9 ng/mL — AB (ref 30.0–100.0)

## 2016-08-05 MED ORDER — ERGOCALCIFEROL 1.25 MG (50000 UT) PO CAPS: 50000.0000 [IU] | ORAL_CAPSULE | ORAL | 0 refills | Status: AC

## 2016-10-11 ENCOUNTER — Other Ambulatory Visit: Payer: Self-pay | Admitting: Physician Assistant

## 2016-10-11 ENCOUNTER — Ambulatory Visit (INDEPENDENT_AMBULATORY_CARE_PROVIDER_SITE_OTHER): Payer: BLUE CROSS/BLUE SHIELD | Admitting: Physician Assistant

## 2016-10-11 ENCOUNTER — Encounter: Payer: Self-pay | Admitting: Physician Assistant

## 2016-10-11 VITALS — BP 112/71 | HR 85 | Temp 98.4°F | Resp 18 | Ht 60.5 in | Wt 117.0 lb

## 2016-10-11 DIAGNOSIS — E782 Mixed hyperlipidemia: Secondary | ICD-10-CM | POA: Diagnosis not present

## 2016-10-11 DIAGNOSIS — E559 Vitamin D deficiency, unspecified: Secondary | ICD-10-CM | POA: Diagnosis not present

## 2016-10-11 DIAGNOSIS — R059 Cough, unspecified: Secondary | ICD-10-CM

## 2016-10-11 DIAGNOSIS — R05 Cough: Secondary | ICD-10-CM

## 2016-10-11 MED ORDER — ERGOCALCIFEROL 1.25 MG (50000 UT) PO CAPS: 50000.0000 [IU] | ORAL_CAPSULE | ORAL | 0 refills | Status: DC

## 2016-10-11 MED ORDER — CLINDAMYCIN HCL 150 MG PO CAPS: 450.0000 mg | ORAL_CAPSULE | Freq: Three times a day (TID) | ORAL | 0 refills | Status: AC

## 2016-10-11 MED ORDER — RANITIDINE HCL 150 MG PO TABS: 150.0000 mg | ORAL_TABLET | Freq: Two times a day (BID) | ORAL | 0 refills | Status: DC

## 2016-10-11 MED ORDER — BENZONATATE 100 MG PO CAPS: 100.0000 mg | ORAL_CAPSULE | Freq: Three times a day (TID) | ORAL | 0 refills | Status: DC | PRN

## 2016-10-11 NOTE — Patient Instructions (Addendum)
Within the next 2-4 weeks, please return for a labs only in regards to your vitamin d and lipid panel Clindamycin capsules What is this medicine? CLINDAMYCIN ([place] sin) is a lincosamide antibiotic. It is used to treat certain kinds of bacterial infections. It will not work for colds, flu, or other viral infections. This medicine may be used for other purposes; ask your health care provider or pharmacist if you have questions. COMMON BRAND NAME(S): Cleocin What should I tell my health care provider before I take this medicine? They need to know if you have any of these conditions: -kidney disease -liver disease -stomach problems like colitis -an unusual or allergic reaction to clindamycin, lincomycin, or other medicines, foods, dyes like tartrazine or preservatives -pregnant or trying to get pregnant -breast-feeding How should I use this medicine? Take this medicine by mouth with a full glass of water. Follow the directions on the prescription label. You can take this medicine with food or on an empty stomach. If the medicine upsets your stomach, take it with food. Take your medicine at regular intervals. Do not take your medicine more often than directed. Take all of your medicine as directed even if you think your are better. Do not skip doses or stop your medicine early. Talk to your pediatrician regarding the use of this medicine in children. Special care may be needed. Overdosage: If you think you have taken too much of this medicine contact a poison control center or emergency room at once. NOTE: This medicine is only for you. Do not share this medicine with others. What if I miss a dose? If you miss a dose, take it as soon as you can. If it is almost time for your next dose, take only that dose. Do not take double or extra doses. What may interact with this medicine? -birth control pills -erythromycin -medicines that relax muscles for surgery -rifampin This list may not  describe all possible interactions. Give your health care provider a list of all the medicines, herbs, non-prescription drugs, or dietary supplements you use. Also tell them if you smoke, drink alcohol, or use illegal drugs. Some items may interact with your medicine. What should I watch for while using this medicine? Tell your doctor or healthcare professional if your symptoms do not start to get better or if they get worse. Do not treat diarrhea with over the counter products. Contact your doctor if you have diarrhea that lasts more than 2 days or if it is severe and watery. What side effects may I notice from receiving this medicine? Side effects that you should report to your doctor or health care professional as soon as possible: -allergic reactions like skin rash, itching or hives, swelling of the face, lips, or tongue -dark urine -pain on swallowing -redness, blistering, peeling or loosening of the skin, including inside the mouth -unusual bleeding or bruising -unusually weak or tired -yellowing of eyes or skin Side effects that usually do not require medical attention (report to your doctor or health care professional if they continue or are bothersome): -diarrhea -itching in the rectal or genital area -joint pain -nausea, vomiting -stomach pain This list may not describe all possible side effects. Call your doctor for medical advice about side effects. You may report side effects to FDA at 1-800-FDA-1088. Where should I keep my medicine? Keep out of the reach of children. Store at room temperature between 20 and 25 degrees C (68 and 77 degrees F). Throw away any unused  medicine after the expiration date. NOTE: This sheet is a summary. It may not cover all possible information. If you have questions about this medicine, talk to your doctor, pharmacist, or health care provider.  2018 Elsevier/Gold Standard (2015-06-25 16:34:00)     IF you received an x-ray today, you will receive  an invoice from [facility]. Please contact [facility] at [phone] with questions or concerns regarding your invoice.   IF you received labwork today, you will receive an invoice from [place]. Please contact [facility] at [phone] with questions or concerns regarding your invoice.   Our billing staff will not be able to assist you with questions regarding bills from these companies.  You will be contacted with the lab results as soon as they are available. The fastest way to get your results is to activate your My Chart account. Instructions are located on the last page of this paperwork. If you have not heard from [place] regarding the results in 2 weeks, please contact this office.

## 2016-10-12 ENCOUNTER — Telehealth: Payer: Self-pay | Admitting: Physician Assistant

## 2016-10-12 NOTE — Telephone Encounter (Signed)
Pt states that she saw [facility] yesterday and was supposed to have 4 medications sent to [facility] but pt did not know the names but she waited three hours and they never came to the pharmacy and she would like them sent in    Best number [phone]

## 2016-10-12 NOTE — Telephone Encounter (Signed)
Pt advised that Rx's have been called into [facility]. Cancelled rx's at [facility], [place]

## 2016-10-15 NOTE — Progress Notes (Signed)
PRIMARY CARE AT [facility] [address] [place] [address] [phone]  Date:  10/11/2016   Name:  Monica Gutierrez   DOB:  Jun 04, 1955   MRN:  [id number]  PCP:  Joretta Bachelor, PA    History of Present Illness:  Monica Gutierrez is a 61 y.o. female patient who presents to PCP with  Chief Complaint  Patient presents with  . Hypertension    follow up   . Medication Refill    vitamin D2     Patient is here today for cc side effect of the medication of amoxicillin.  She was sseen by her dentist for tooth extraction.  She was given amoxicillin, but feels like she is coughing due to the medication.  No sob or dyspnea.  No trouble swallowing.  She has taken vitamin D weekly.  She notes no side effects, but states taht she feels better.   Patient Active Problem List   Diagnosis Date Noted  . Rectal mucosa prolapse 11/04/2015  . Essential hypertension, benign 02/16/2012  . Colon polyps 02/16/2012    Past Medical History:  Diagnosis Date  . Hypertension     Past Surgical History:  Procedure Laterality Date  . CESAREAN SECTION    . COLON SURGERY  2010   5 polyps removed 2 years ago  . TUBAL LIGATION      Social History  Substance Use Topics  . Smoking status: Former Smoker    Packs/day: 0.25    Years: 15.00    Types: Cigarettes  . Smokeless tobacco: Never Used  . Alcohol use 0.6 oz/week    1 Cans of beer per week     Comment: one beer on the holidays    No family history on file.  No Known Allergies  Medication list has been reviewed and updated.  Current Outpatient Prescriptions on File Prior to Visit  Medication Sig Dispense Refill  . Calcium Carb-Cholecalciferol (CALCIUM + D3 PO) Take by mouth.    . hydrochlorothiazide (HYDRODIURIL) 25 MG tablet Take 1 tablet (25 mg total) by mouth daily. 30 tablet 11  . LORATADINE PO Take by mouth.    Marland Kitchen atorvastatin (LIPITOR) 40 MG tablet Take 1 tablet (40 mg total) by mouth daily. (Patient not taking: Reported on 10/11/2016) 90  tablet 3  . [place]-Glucosamine-Vit D (GLUCOSAMINE COMPLEX PO) Take 1 capsule by mouth daily. Reported on 09/10/2015    . meloxicam (MOBIC) 7.5 MG tablet Take 1 tablet (7.5 mg total) by mouth daily. (Patient not taking: Reported on 10/11/2016) 30 tablet 0  . omeprazole (PRILOSEC) 20 MG capsule Take 1 capsule (20 mg total) by mouth daily. (Patient not taking: Reported on 10/11/2016) 30 capsule 0  . OVER THE COUNTER MEDICATION Take 1 tablet by mouth daily. Allergy tablet, pink, $ over the counter at [facility]    . tizanidine (ZANAFLEX) 2 MG capsule Take 1 capsule (2 mg total) by mouth 3 (three) times daily as needed for muscle spasms. (Patient not taking: Reported on 10/11/2016) 20 capsule 0  . UNABLE TO FIND Med Name: Zakuror balls     No current facility-administered medications on file prior to visit.     ROS ROS otherwise unremarkable unless listed above.  Physical Examination: BP 112/71   Pulse 85   Temp 98.4 F (36.9 C) (Oral)   Resp 18   Ht 5' 0.5" (1.537 m)   Wt 117 lb (53.1 kg)   SpO2 96%   BMI 22.47 kg/m  Ideal  Body Weight: Weight in (lb) to have BMI = 25: 129.9  Physical Exam  Constitutional: She is oriented to person, place, and time. She appears well-developed and well-nourished. No distress.  HENT:  Head: Normocephalic and atraumatic.  Right Ear: External ear normal.  Left Ear: External ear normal.  Eyes: Pupils are equal, round, and reactive to light. Conjunctivae and EOM are normal.  Cardiovascular: Normal rate.   Pulmonary/Chest: Effort normal. No respiratory distress.  Neurological: She is alert and oriented to person, place, and time.  Skin: She is not diaphoretic.  Psychiatric: She has a normal mood and affect. Her behavior is normal.     Assessment and Plan: Monica Gutierrez is a 61 y.o. female who is here today for medication problem, and recheck cholesterol Not fasting currently.  She will return for lipid panel and vitamin d check fasting in 2-4 weeks. 1.  Mixed hyperlipidemia - Lipid panel; Future   Ivar Drape, PA-C [facility] 10/15/2016 8:34 AM

## 2016-11-01 ENCOUNTER — Encounter: Payer: Self-pay | Admitting: Physician Assistant

## 2016-11-01 ENCOUNTER — Ambulatory Visit (INDEPENDENT_AMBULATORY_CARE_PROVIDER_SITE_OTHER): Payer: BLUE CROSS/BLUE SHIELD | Admitting: Physician Assistant

## 2016-11-01 VITALS — BP 114/79 | HR 76 | Temp 98.4°F | Resp 16 | Ht 60.5 in | Wt 117.2 lb

## 2016-11-01 DIAGNOSIS — H60391 Other infective otitis externa, right ear: Secondary | ICD-10-CM | POA: Diagnosis not present

## 2016-11-01 MED ORDER — AMOXICILLIN 500 MG PO CAPS: 500.0000 mg | ORAL_CAPSULE | Freq: Two times a day (BID) | ORAL | 0 refills | Status: AC

## 2016-11-01 MED ORDER — NEOMYCIN-POLYMYXIN-HC 3.5-10000-1 OT SOLN: 4.0000 [drp] | Freq: Three times a day (TID) | OTIC | 0 refills | Status: DC

## 2016-11-01 NOTE — Patient Instructions (Addendum)
I would like you to use the ear drops first, and if this improved your symptoms after 48 hours.  Continue for additional 5 days.  If it is not improving, you may start the amoxicillin. You may continue to take the tylenol.  Otitis Externa Otitis externa is an infection of the outer ear canal. The outer ear canal is the area between the outside of the ear and the eardrum. Otitis externa is sometimes called "swimmer's ear." Follow these instructions at home:  If you were given antibiotic ear drops, use them as told by your doctor. Do not stop using them even if your condition gets better.  Take over-the-counter and prescription medicines only as told by your doctor.  Keep all follow-up visits as told by your doctor. This is important. How is this prevented?  Keep your ear dry. Use the corner of a towel to dry your ear after you swim or bathe.  Try not to scratch or put things in your ear. Doing these things makes it easier for germs to grow in your ear.  Avoid swimming in lakes, dirty water, or pools that may not have the right amount of a chemical called chlorine.  Consider making ear drops and putting 3 or 4 drops in each ear after you swim. Ask your doctor about how you can make ear drops. Contact a doctor if:  You have a fever.  After 3 days your ear is still red, swollen, or painful.  After 3 days you still have pus coming from your ear.  Your redness, swelling, or pain gets worse.  You have a really bad headache.  You have redness, swelling, pain, or tenderness behind your ear. This information is not intended to replace advice given to you by your health care provider. Make sure you discuss any questions you have with your health care provider. Document Released: 09/08/2007 Document Revised: 04/17/2015 Document Reviewed: 12/30/2014 Elsevier Interactive Patient Education  2018 [employer].     IF you received an x-ray today, you will receive an invoice from [facility]  Radiology. Please contact [facility] Radiology at [phone] with questions or concerns regarding your invoice.   IF you received labwork today, you will receive an invoice from [place]. Please contact [facility] at [phone] with questions or concerns regarding your invoice.   Our billing staff will not be able to assist you with questions regarding bills from these companies.  You will be contacted with the lab results as soon as they are available. The fastest way to get your results is to activate your My Chart account. Instructions are located on the last page of this paperwork. If you have not heard from [place] regarding the results in 2 weeks, please contact this office.

## 2016-11-01 NOTE — Progress Notes (Signed)
PRIMARY CARE AT [facility] [address] [place] [address] [phone]  Date:  11/01/2016   Name:  Monica Gutierrez   DOB:  01/15/1956   MRN:  [id number]  PCP:  Joretta Bachelor, PA    History of Present Illness:  Monica Gutierrez is a 61 y.o. female patient who presents to PCP with  Chief Complaint  Patient presents with  . Ear Pain    Right ear. Started Saturday  . Dizziness  . Lymphadenopathy    Right side of neck/ear  . Medication Refill    Atorvastatin, hctz     Patient is here with right sided ear pain that started 2 days ago.  It continues to be painful and sharp intermittently.  She has noted no fever or malaise.  She has no congestion, cough, or sore throat.  Pain radiates around the ear, and down the neck right sided.  She has no swam, but notes washing hair and feeling like she had it caught in her ear.   Patient Active Problem List   Diagnosis Date Noted  . Rectal mucosa prolapse 11/04/2015  . Essential hypertension, benign 02/16/2012  . Colon polyps 02/16/2012    Past Medical History:  Diagnosis Date  . Hypertension     Past Surgical History:  Procedure Laterality Date  . CESAREAN SECTION    . COLON SURGERY  2010   5 polyps removed 2 years ago  . TUBAL LIGATION      Social History  Substance Use Topics  . Smoking status: Former Smoker    Packs/day: 0.25    Years: 15.00    Types: Cigarettes  . Smokeless tobacco: Never Used  . Alcohol use 0.6 oz/week    1 Cans of beer per week     Comment: one beer on the holidays    No family history on file.  No Known Allergies  Medication list has been reviewed and updated.  Current Outpatient Prescriptions on File Prior to Visit  Medication Sig Dispense Refill  . atorvastatin (LIPITOR) 40 MG tablet Take 1 tablet (40 mg total) by mouth daily. 90 tablet 3  . benzonatate (TESSALON) 100 MG capsule Take 1-2 capsules (100-200 mg total) by mouth 3 (three) times daily as needed for cough. 40 capsule 0  .  Boswellia-Glucosamine-Vit D (GLUCOSAMINE COMPLEX PO) Take 1 capsule by mouth daily. Reported on 09/10/2015    . Calcium Carb-Cholecalciferol (CALCIUM + D3 PO) Take by mouth.    . ergocalciferol (DRISDOL) 50000 units capsule Take 1 capsule (50,000 Units total) by mouth once a week. 4 capsule 0  . fexofenadine (ALLEGRA) 180 MG tablet Take 180 mg by mouth daily.    . hydrochlorothiazide (HYDRODIURIL) 25 MG tablet Take 1 tablet (25 mg total) by mouth daily. 30 tablet 11  . LORATADINE PO Take by mouth.    . meloxicam (MOBIC) 7.5 MG tablet Take 1 tablet (7.5 mg total) by mouth daily. 30 tablet 0  . omeprazole (PRILOSEC) 20 MG capsule Take 1 capsule (20 mg total) by mouth daily. 30 capsule 0  . OVER THE COUNTER MEDICATION Take 1 tablet by mouth daily. Allergy tablet, pink, $ over the counter at [facility]    . ranitidine (ZANTAC) 150 MG tablet Take 1 tablet (150 mg total) by mouth 2 (two) times daily. 30 tablet 0  . tizanidine (ZANAFLEX) 2 MG capsule Take 1 capsule (2 mg total) by mouth 3 (three) times daily as needed for muscle spasms. 20 capsule 0  .  UNABLE TO FIND Med Name: Zakuror balls     No current facility-administered medications on file prior to visit.     ROS ROS otherwise unremarkable unless listed above.  Physical Examination: BP 114/79   Pulse 76   Temp 98.4 F (36.9 C) (Oral)   Resp 16   Ht 5' 0.5" (1.537 m)   Wt 117 lb 3.2 oz (53.2 kg)   SpO2 98%   BMI 22.51 kg/m  Ideal Body Weight: Weight in (lb) to have BMI = 25: 129.9  Physical Exam  Constitutional: She is oriented to person, place, and time. She appears well-developed and well-nourished. No distress.  HENT:  Head: Normocephalic and atraumatic.  Right Ear: External ear normal.  Left Ear: External ear normal.  Erythema and tenderness at the 7 oclock region of the ear canal.  Mild erythema to the TM without bulging or retraction  Eyes: Pupils are equal, round, and reactive to light. Conjunctivae and EOM are normal.   Cardiovascular: Normal rate.   Pulmonary/Chest: Effort normal. No respiratory distress.  Neurological: She is alert and oriented to person, place, and time.  Skin: She is not diaphoretic.  Psychiatric: She has a normal mood and affect. Her behavior is normal.     Assessment and Plan: Monica Gutierrez is a 61 y.o. female who is here today for ear pain. Advised ear drop for otitis externa.  Likely not middle ear.  Advised to apply ear drops and if first 48 hours noticeable improvement, do not pick up the amoxicillin and continue ear drops.  She will pick up if no improvement of her symptoms.   Infective otitis externa of right ear - Plan: neomycin-polymyxin-hydrocortisone (CORTISPORIN) OTIC solution, amoxicillin (AMOXIL) 500 MG capsule  Ivar Drape, PA-C [facility] 7/30/20189:09 AM

## 2016-11-08 ENCOUNTER — Ambulatory Visit: Payer: BLUE CROSS/BLUE SHIELD | Admitting: Family Medicine

## 2016-11-08 ENCOUNTER — Other Ambulatory Visit: Payer: Self-pay | Admitting: Physician Assistant

## 2016-11-08 DIAGNOSIS — E559 Vitamin D deficiency, unspecified: Secondary | ICD-10-CM

## 2016-11-08 DIAGNOSIS — E782 Mixed hyperlipidemia: Secondary | ICD-10-CM

## 2016-11-09 LAB — VITAMIN D 25 HYDROXY (VIT D DEFICIENCY, FRACTURES): Vit D, 25-Hydroxy: 46 ng/mL (ref 30.0–100.0)

## 2016-11-09 LAB — LIPID PANEL
CHOLESTEROL TOTAL: 169 mg/dL (ref 100–199)
Chol/HDL Ratio: 3.3 ratio (ref 0.0–4.4)
HDL: 52 mg/dL (ref >39)
LDL Calculated: 76 mg/dL (ref 0–99)
Triglycerides: 205 mg/dL — ABNORMAL HIGH (ref 0–149)
VLDL CHOLESTEROL CAL: 41 mg/dL — AB (ref 5–40)

## 2016-11-25 ENCOUNTER — Encounter: Payer: Self-pay | Admitting: Physician Assistant

## 2016-11-25 ENCOUNTER — Ambulatory Visit (INDEPENDENT_AMBULATORY_CARE_PROVIDER_SITE_OTHER): Payer: BLUE CROSS/BLUE SHIELD | Admitting: Physician Assistant

## 2016-11-25 VITALS — BP 111/69 | HR 97 | Temp 99.8°F | Resp 18 | Ht 60.83 in | Wt 117.4 lb

## 2016-11-25 DIAGNOSIS — K529 Noninfective gastroenteritis and colitis, unspecified: Secondary | ICD-10-CM

## 2016-11-25 DIAGNOSIS — R109 Unspecified abdominal pain: Secondary | ICD-10-CM

## 2016-11-25 DIAGNOSIS — R110 Nausea: Secondary | ICD-10-CM

## 2016-11-25 DIAGNOSIS — R197 Diarrhea, unspecified: Secondary | ICD-10-CM | POA: Diagnosis not present

## 2016-11-25 LAB — POCT CBC
Granulocyte percent: 89.6 %G — AB (ref 37–80)
HCT, POC: 39.1 % (ref 37.7–47.9)
HEMOGLOBIN: 13.2 g/dL (ref 12.2–16.2)
Lymph, poc: 1 (ref 0.6–3.4)
MCH: 31.5 pg — AB (ref 27–31.2)
MCHC: 33.9 g/dL (ref 31.8–35.4)
MCV: 93 fL (ref 80–97)
MID (cbc): 0.3 (ref 0–0.9)
MPV: 5.8 fL (ref 0–99.8)
POC GRANULOCYTE: 11.3 — AB (ref 2–6.9)
POC LYMPH PERCENT: 7.7 %L — AB (ref 10–50)
POC MID %: 2.7 % (ref 0–12)
Platelet Count, POC: 176 10*3/uL (ref 142–424)
RBC: 4.21 M/uL (ref 4.04–5.48)
RDW, POC: 13.6 %
WBC: 12.6 10*3/uL — AB (ref 4.6–10.2)

## 2016-11-25 LAB — POCT URINALYSIS DIP (MANUAL ENTRY)
BILIRUBIN UA: NEGATIVE
GLUCOSE UA: NEGATIVE mg/dL
Ketones, POC UA: NEGATIVE mg/dL
Leukocytes, UA: NEGATIVE
Nitrite, UA: NEGATIVE
Protein Ur, POC: NEGATIVE mg/dL
SPEC GRAV UA: 1.01 (ref 1.010–1.025)
Urobilinogen, UA: 0.2 E.U./dL
pH, UA: 6 (ref 5.0–8.0)

## 2016-11-25 MED ORDER — HYOSCYAMINE SULFATE 0.125 MG SL SUBL: 0.1250 mg | SUBLINGUAL_TABLET | SUBLINGUAL | 0 refills | Status: DC | PRN

## 2016-11-25 MED ORDER — ONDANSETRON 8 MG PO TBDP: 8.0000 mg | ORAL_TABLET | Freq: Three times a day (TID) | ORAL | 0 refills | Status: DC | PRN

## 2016-11-25 NOTE — Progress Notes (Signed)
PRIMARY CARE AT [facility] [address] [place] [address] [phone]  Date:  11/25/2016   Name:  Monica Gutierrez   DOB:  1956-02-16   MRN:  [id number]  PCP:  Joretta Bachelor, PA    History of Present Illness:  Monica Gutierrez is a 61 y.o. female patient who presents to PCP with  Chief Complaint  Patient presents with  . Diarrhea    X 1 day  . Chills    X 1 day- with headache, nausea  . Abdominal Pain    X 2 days     Abdominal pain, fever and chills for the last 2 days.  Abdominal pain is all over her abdomen.  She has diarrhea non-bloody or black.  She has nausea without emesis.  No dysuria, hematuria, or frequency.  She recalls having [employer] chicken sandwich.  No one else known to be complaining of similar symptoms.  No recent visits to shelters, hospitals, or nursing home.  No known sick contact.  Patient Active Problem List   Diagnosis Date Noted  . Rectal mucosa prolapse 11/04/2015  . Essential hypertension, benign 02/16/2012  . Colon polyps 02/16/2012    Past Medical History:  Diagnosis Date  . Hypertension     Past Surgical History:  Procedure Laterality Date  . CESAREAN SECTION    . COLON SURGERY  2010   5 polyps removed 2 years ago  . TUBAL LIGATION      Social History  Substance Use Topics  . Smoking status: Former Smoker    Packs/day: 0.25    Years: 15.00    Types: Cigarettes  . Smokeless tobacco: Never Used  . Alcohol use 0.6 oz/week    1 Cans of beer per week     Comment: one beer on the holidays    History reviewed. No pertinent family history.  No Known Allergies  Medication list has been reviewed and updated.  Current Outpatient Prescriptions on File Prior to Visit  Medication Sig Dispense Refill  . atorvastatin (LIPITOR) 40 MG tablet Take 1 tablet (40 mg total) by mouth daily. 90 tablet 3  . Boswellia-Glucosamine-Vit D (GLUCOSAMINE COMPLEX PO) Take 1 capsule by mouth daily. Reported on 09/10/2015    . Calcium Carb-Cholecalciferol  (CALCIUM + D3 PO) Take by mouth.    . fexofenadine (ALLEGRA) 180 MG tablet Take 180 mg by mouth daily.    . hydrochlorothiazide (HYDRODIURIL) 25 MG tablet Take 1 tablet (25 mg total) by mouth daily. 30 tablet 11  . LORATADINE PO Take by mouth.    . meloxicam (MOBIC) 7.5 MG tablet Take 1 tablet (7.5 mg total) by mouth daily. 30 tablet 0  . neomycin-polymyxin-hydrocortisone (CORTISPORIN) OTIC solution Place 4 drops into the right ear 3 (three) times daily. 10 mL 0  . omeprazole (PRILOSEC) 20 MG capsule Take 1 capsule (20 mg total) by mouth daily. 30 capsule 0  . OVER THE COUNTER MEDICATION Take 1 tablet by mouth daily. Allergy tablet, pink, $ over the counter at [employer] [facility]    . ranitidine (ZANTAC) 150 MG tablet Take 1 tablet (150 mg total) by mouth 2 (two) times daily. 30 tablet 0  . tizanidine (ZANAFLEX) 2 MG capsule Take 1 capsule (2 mg total) by mouth 3 (three) times daily as needed for muscle spasms. 20 capsule 0  . UNABLE TO FIND Med Name: Zakuror balls    . Vitamin D, Ergocalciferol, (DRISDOL) 50000 units CAPS capsule TAKE 1 CAPSULE BY MOUTH ONCE A WEEK  4 capsule 0  . benzonatate (TESSALON) 100 MG capsule Take 1-2 capsules (100-200 mg total) by mouth 3 (three) times daily as needed for cough. (Patient not taking: Reported on 11/25/2016) 40 capsule 0   No current facility-administered medications on file prior to visit.     ROS ROS otherwise unremarkable unless listed above.  Physical Examination: BP 111/69 (BP Location: Right Arm, Patient Position: Sitting, Cuff Size: Normal)   Pulse 97   Temp 99.8 F (37.7 C) (Oral)   Resp 18   Ht 5' 0.83" (1.545 m)   Wt 117 lb 6.4 oz (53.3 kg)   SpO2 96%   BMI 22.31 kg/m  Ideal Body Weight: Weight in (lb) to have BMI = 25: 131.3  Physical Exam  Constitutional: She is oriented to person, place, and time. She appears well-developed and well-nourished. No distress.  HENT:  Head: Normocephalic and atraumatic.  Right Ear: External ear normal.   Left Ear: External ear normal.  Eyes: Pupils are equal, round, and reactive to light. Conjunctivae and EOM are normal.  Cardiovascular: Normal rate.   Pulmonary/Chest: Effort normal. No respiratory distress.  Abdominal: Soft. Normal appearance and bowel sounds are normal. There is generalized tenderness.  Neurological: She is alert and oriented to person, place, and time.  Skin: She is not diaphoretic.  Psychiatric: She has a normal mood and affect. Her behavior is normal.    Results for orders placed or performed in visit on 11/25/16  POCT urinalysis dipstick  Result Value Ref Range   Color, UA yellow yellow   Clarity, UA clear clear   Glucose, UA negative negative mg/dL   Bilirubin, UA negative negative   Ketones, POC UA negative negative mg/dL   Spec Grav, UA 1.010 1.010 - 1.025   Blood, UA moderate (A) negative   pH, UA 6.0 5.0 - 8.0   Protein Ur, POC negative negative mg/dL   Urobilinogen, UA 0.2 0.2 or 1.0 E.U./dL   Nitrite, UA Negative Negative   Leukocytes, UA Negative Negative  POCT CBC  Result Value Ref Range   WBC 12.6 (A) 4.6 - 10.2 K/uL   Lymph, poc 1.0 0.6 - 3.4   POC LYMPH PERCENT 7.7 (A) 10 - 50 %L   MID (cbc) 0.3 0 - 0.9   POC MID % 2.7 0 - 12 %M   POC Granulocyte 11.3 (A) 2 - 6.9   Granulocyte percent 89.6 (A) 37 - 80 %G   RBC 4.21 4.04 - 5.48 M/uL   Hemoglobin 13.2 12.2 - 16.2 g/dL   HCT, POC 39.1 37.7 - 47.9 %   MCV 93.0 80 - 97 fL   MCH, POC 31.5 (A) 27 - 31.2 pg   MCHC 33.9 31.8 - 35.4 g/dL   RDW, POC 13.6 %   Platelet Count, POC 176 142 - 424 K/uL   MPV 5.8 0 - 99.8 fL     Assessment and Plan: Monica Gutierrez is a 61 y.o. female who is here today for cc of abdominal pain and diarrhea Likely gastroenteritis. Abdominal pain, unspecified abdominal location - Plan: POCT urinalysis dipstick, POCT CBC, Urine Culture, GI Profile, Stool, PCR, hyoscyamine (LEVSIN SL) 0.125 MG SL tablet  Diarrhea, unspecified type - Plan: GI Profile, Stool, PCR,  hyoscyamine (LEVSIN SL) 0.125 MG SL tablet  Noninfectious gastroenteritis, unspecified type  Nausea without vomiting - Plan: ondansetron (ZOFRAN-ODT) 8 MG disintegrating tablet  Ivar Drape, PA-C [facility] 9/3/20184:45 PM

## 2016-11-25 NOTE — Patient Instructions (Addendum)
I would like you to pick up ORS solution from the pharmacy, and take for hydration.   I would like you to hydrate as much as possible. I want you to follow up in 3 days if your symptoms are not improving at all.  Take ibuprofen or tylenol for your pain  Food Choices to Help Relieve Diarrhea, Adult When you have diarrhea, the foods you eat and your eating habits are very important. Choosing the right foods and drinks can help:  Relieve diarrhea.  Replace lost fluids and nutrients.  Prevent dehydration.  What general guidelines should I follow? Relieving diarrhea  Choose foods with less than 2 g or .07 oz. of fiber per serving.  Limit fats to less than 8 tsp (38 g or 1.34 oz.) a day.  Avoid the following: ? Foods and beverages sweetened with high-fructose corn syrup, honey, or sugar alcohols such as xylitol, sorbitol, and mannitol. ? Foods that contain a lot of fat or sugar. ? Fried, greasy, or spicy foods. ? High-fiber grains, breads, and cereals. ? Raw fruits and vegetables.  Eat foods that are rich in probiotics. These foods include dairy products such as yogurt and fermented milk products. They help increase healthy bacteria in the stomach and intestines (gastrointestinal tract, or GI tract).  If you have lactose intolerance, avoid dairy products. These may make your diarrhea worse.  Take medicine to help stop diarrhea (antidiarrheal medicine) only as told by your health care provider. Replacing nutrients  Eat small meals or snacks every 3-4 hours.  Eat bland foods, such as white rice, toast, or baked potato, until your diarrhea starts to get better. Gradually reintroduce nutrient-rich foods as tolerated or as told by your health care provider. This includes: ? Well-cooked protein foods. ? Peeled, seeded, and soft-cooked fruits and vegetables. ? Low-fat dairy products.  Take vitamin and mineral supplements as told by your health care provider. Preventing  dehydration   Start by sipping water or a special solution to prevent dehydration (oral rehydration solution, ORS). Urine that is clear or pale yellow means that you are getting enough fluid.  Try to drink at least 8-10 cups of fluid each day to help replace lost fluids.  You may add other liquids in addition to water, such as clear juice or decaffeinated sports drinks, as tolerated or as told by your health care provider.  Avoid drinks with caffeine, such as coffee, tea, or soft drinks.  Avoid alcohol. What foods are recommended? The items listed may not be a complete list. Talk with your health care provider about what dietary choices are best for you. Grains White rice. White, [place], or pita breads (fresh or toasted), including plain rolls, buns, or bagels. White pasta. Saltine, soda, or graham crackers. Pretzels. Low-fiber cereal. Cooked cereals made with water (such as cornmeal, farina, or cream cereals). Plain muffins. Matzo. Melba toast. Zwieback. Vegetables Potatoes (without the skin). Most well-cooked and canned vegetables without skins or seeds. Tender lettuce. Fruits Apple sauce. Fruits canned in juice. Cooked apricots, cherries, grapefruit, peaches, pears, or plums. Fresh bananas and cantaloupe. Meats and other protein foods Baked or boiled chicken. Eggs. Tofu. Fish. Seafood. Smooth nut butters. Ground or well-cooked tender beef, ham, veal, lamb, pork, or poultry. Dairy Plain yogurt, kefir, and unsweetened liquid yogurt. Lactose-free milk, buttermilk, skim milk, or soy milk. Low-fat or nonfat hard cheese. Beverages Water. Low-calorie sports drinks. Fruit juices without pulp. Strained tomato and vegetable juices. Decaffeinated teas. Sugar-free beverages not sweetened with sugar alcohols. Oral  rehydration solutions, if approved by your health care provider. Seasoning and other foods Bouillon, broth, or soups made from recommended foods. What foods are not recommended? The  items listed may not be a complete list. Talk with your health care provider about what dietary choices are best for you. Grains Whole grain, whole wheat, bran, or rye breads, rolls, pastas, and crackers. Wild or brown rice. Whole grain or bran cereals. Barley. Oats and oatmeal. Corn tortillas or taco shells. Granola. Popcorn. Vegetables Raw vegetables. Fried vegetables. Cabbage, broccoli, Brussels sprouts, artichokes, baked beans, beet greens, corn, kale, legumes, peas, sweet potatoes, and yams. Potato skins. Cooked spinach and cabbage. Fruits Dried fruit, including raisins and dates. Raw fruits. Stewed or dried prunes. Canned fruits with syrup. Meat and other protein foods Fried or fatty meats. Deli meats. Chunky nut butters. Nuts and seeds. Beans and lentils. Berniece Salines. Hot dogs. Sausage. Dairy High-fat cheeses. Whole milk, chocolate milk, and beverages made with milk, such as milk shakes. Half-and-half. Cream. sour cream. Ice cream. Beverages Caffeinated beverages (such as coffee, tea, soda, or energy drinks). Alcoholic beverages. Fruit juices with pulp. Prune juice. Soft drinks sweetened with high-fructose corn syrup or sugar alcohols. High-calorie sports drinks. Fats and oils Butter. Cream sauces. Margarine. Salad oils. Plain salad dressings. Olives. Avocados. Mayonnaise. Sweets and desserts Sweet rolls, doughnuts, and sweet breads. Sugar-free desserts sweetened with sugar alcohols such as xylitol and sorbitol. Seasoning and other foods Honey. Hot sauce. Chili powder. Gravy. Cream-based or milk-based soups. Pancakes and waffles. Summary  When you have diarrhea, the foods you eat and your eating habits are very important.  Make sure you get at least 8-10 cups of fluid each day, or enough to keep your urine clear or pale yellow.  Eat bland foods and gradually reintroduce healthy, nutrient-rich foods as tolerated, or as told by your health care provider.  Avoid high-fiber, fried, greasy, or  spicy foods. This information is not intended to replace advice given to you by your health care provider. Make sure you discuss any questions you have with your health care provider. Document Released: 06/12/2003 Document Revised: 03/19/2016 Document Reviewed: 03/19/2016 Elsevier Interactive Patient Education  2017 [employer].     IF you received an x-ray today, you will receive an invoice from [facility]. Please contact [facility] at [phone] with questions or concerns regarding your invoice.   IF you received labwork today, you will receive an invoice from [place]. Please contact [facility] at [phone] with questions or concerns regarding your invoice.   Our billing staff will not be able to assist you with questions regarding bills from these companies.  You will be contacted with the lab results as soon as they are available. The fastest way to get your results is to activate your My Chart account. Instructions are located on the last page of this paperwork. If you have not heard from [place] regarding the results in 2 weeks, please contact this office.

## 2016-11-26 LAB — URINE CULTURE: Organism ID, Bacteria: NO GROWTH

## 2016-11-28 LAB — GI PROFILE, STOOL, PCR
Adenovirus F 40/41: NOT DETECTED
Astrovirus: NOT DETECTED
C DIFFICILE TOXIN A/B: NOT DETECTED
CRYPTOSPORIDIUM: NOT DETECTED
CYCLOSPORA CAYETANENSIS: NOT DETECTED
Campylobacter: DETECTED — AB
ENTAMOEBA HISTOLYTICA: NOT DETECTED
Enteroaggregative E coli: NOT DETECTED
Enteropathogenic E coli: NOT DETECTED
Enterotoxigenic E coli: DETECTED — AB
Giardia lamblia: NOT DETECTED
Norovirus GI/GII: NOT DETECTED
PLESIOMONAS SHIGELLOIDES: NOT DETECTED
Rotavirus A: NOT DETECTED
SALMONELLA: NOT DETECTED
SAPOVIRUS: NOT DETECTED
SHIGELLA/ENTEROINVASIVE E COLI: NOT DETECTED
Shiga-toxin-producing E coli: NOT DETECTED
VIBRIO CHOLERAE: NOT DETECTED
Vibrio: NOT DETECTED
Yersinia enterocolitica: NOT DETECTED

## 2016-12-24 ENCOUNTER — Other Ambulatory Visit: Payer: Self-pay | Admitting: Physician Assistant

## 2016-12-24 DIAGNOSIS — M25542 Pain in joints of left hand: Principal | ICD-10-CM

## 2016-12-24 DIAGNOSIS — M25541 Pain in joints of right hand: Secondary | ICD-10-CM

## 2016-12-25 NOTE — Telephone Encounter (Signed)
Please advise 

## 2016-12-29 ENCOUNTER — Other Ambulatory Visit: Payer: Self-pay | Admitting: Family Medicine

## 2016-12-29 DIAGNOSIS — E559 Vitamin D deficiency, unspecified: Secondary | ICD-10-CM

## 2016-12-30 NOTE — Telephone Encounter (Signed)
Do you want her to continue over the counter Vit D?

## 2017-01-26 ENCOUNTER — Other Ambulatory Visit: Payer: Self-pay | Admitting: Physician Assistant

## 2017-01-26 DIAGNOSIS — M25541 Pain in joints of right hand: Secondary | ICD-10-CM

## 2017-01-26 DIAGNOSIS — M25542 Pain in joints of left hand: Principal | ICD-10-CM

## 2017-01-26 NOTE — Telephone Encounter (Signed)
This is not an every day medication.  She should not need this daily.  There are adverse reactions to taking every day.

## 2017-01-31 ENCOUNTER — Other Ambulatory Visit: Payer: Self-pay | Admitting: Physician Assistant

## 2017-01-31 DIAGNOSIS — M25542 Pain in joints of left hand: Principal | ICD-10-CM

## 2017-01-31 DIAGNOSIS — M25541 Pain in joints of right hand: Secondary | ICD-10-CM

## 2017-03-01 ENCOUNTER — Other Ambulatory Visit: Payer: Self-pay | Admitting: Physician Assistant

## 2017-03-01 DIAGNOSIS — E559 Vitamin D deficiency, unspecified: Secondary | ICD-10-CM

## 2017-03-01 NOTE — Telephone Encounter (Signed)
Yes, thanks

## 2017-03-01 NOTE — Telephone Encounter (Signed)
Monica Maryland do you want her to take over the counter now?

## 2017-07-06 ENCOUNTER — Encounter: Payer: Self-pay | Admitting: Physician Assistant

## 2017-07-14 ENCOUNTER — Other Ambulatory Visit: Payer: Self-pay | Admitting: Physician Assistant

## 2017-07-14 DIAGNOSIS — I10 Essential (primary) hypertension: Secondary | ICD-10-CM

## 2017-07-14 NOTE — Telephone Encounter (Signed)
Please advise, refilled hydrodiuril for 30 days, needs OV for refills

## 2017-07-14 NOTE — Telephone Encounter (Signed)
Hydrodiuril refill request  LOV where this was address 09/10/15 with Colletta Maryland English.     LOV 11/25/16 with English but for abd issues, BP not addressed  [place], [place] - [place] [address]

## 2017-07-25 ENCOUNTER — Encounter: Payer: Self-pay | Admitting: Physician Assistant

## 2017-07-25 ENCOUNTER — Ambulatory Visit: Payer: BLUE CROSS/BLUE SHIELD | Admitting: Physician Assistant

## 2017-07-25 VITALS — BP 106/66 | HR 88 | Temp 98.6°F | Resp 16 | Ht 60.0 in | Wt 117.0 lb

## 2017-07-25 DIAGNOSIS — J302 Other seasonal allergic rhinitis: Secondary | ICD-10-CM | POA: Diagnosis not present

## 2017-07-25 DIAGNOSIS — E785 Hyperlipidemia, unspecified: Secondary | ICD-10-CM

## 2017-07-25 DIAGNOSIS — K921 Melena: Secondary | ICD-10-CM

## 2017-07-25 DIAGNOSIS — Z860100 Personal history of colon polyps, unspecified: Secondary | ICD-10-CM

## 2017-07-25 DIAGNOSIS — I10 Essential (primary) hypertension: Secondary | ICD-10-CM

## 2017-07-25 LAB — LIPID PANEL
CHOLESTEROL TOTAL: 158 mg/dL (ref 100–199)
Chol/HDL Ratio: 4 ratio (ref 0.0–4.4)
HDL: 40 mg/dL (ref >39)
LDL Calculated: 55 mg/dL (ref 0–99)
Triglycerides: 315 mg/dL — ABNORMAL HIGH (ref 0–149)
VLDL CHOLESTEROL CAL: 63 mg/dL — AB (ref 5–40)

## 2017-07-25 LAB — COMPREHENSIVE METABOLIC PANEL
ALBUMIN: 4.5 g/dL (ref 3.6–4.8)
ALK PHOS: 73 IU/L (ref 39–117)
ALT: 24 IU/L (ref 0–32)
AST: 27 IU/L (ref 0–40)
Albumin/Globulin Ratio: 1.8 (ref 1.2–2.2)
BILIRUBIN TOTAL: 0.6 mg/dL (ref 0.0–1.2)
BUN / CREAT RATIO: 18 (ref 12–28)
BUN: 12 mg/dL (ref 8–27)
CHLORIDE: 97 mmol/L (ref 96–106)
CO2: 27 mmol/L (ref 20–29)
Calcium: 9.5 mg/dL (ref 8.7–10.3)
Creatinine, Ser: 0.67 mg/dL (ref 0.57–1.00)
GFR calc Af Amer: 110 mL/min/{1.73_m2} (ref >59)
GFR calc non Af Amer: 96 mL/min/{1.73_m2} (ref >59)
GLUCOSE: 100 mg/dL — AB (ref 65–99)
Globulin, Total: 2.5 g/dL (ref 1.5–4.5)
Potassium: 3.6 mmol/L (ref 3.5–5.2)
Sodium: 139 mmol/L (ref 134–144)
Total Protein: 7 g/dL (ref 6.0–8.5)

## 2017-07-25 LAB — HEMOGLOBIN A1C
ESTIMATED AVERAGE GLUCOSE: 123 mg/dL
Hgb A1c MFr Bld: 5.9 % — ABNORMAL HIGH (ref 4.8–5.6)

## 2017-07-25 MED ORDER — ALBUTEROL SULFATE HFA 108 (90 BASE) MCG/ACT IN AERS: 2.0000 | INHALATION_SPRAY | RESPIRATORY_TRACT | 1 refills | Status: DC | PRN

## 2017-07-25 MED ORDER — CETIRIZINE HCL 10 MG PO TABS
10.0000 mg | ORAL_TABLET | Freq: Every day | ORAL | 11 refills | Status: AC
Start: 2017-07-25 — End: ?

## 2017-07-25 MED ORDER — ATORVASTATIN CALCIUM 40 MG PO TABS: 40.0000 mg | ORAL_TABLET | Freq: Every day | ORAL | 3 refills | Status: DC

## 2017-07-25 MED ORDER — HYDROCHLOROTHIAZIDE 25 MG PO TABS: 25.0000 mg | ORAL_TABLET | Freq: Every day | ORAL | 1 refills | Status: DC

## 2017-07-25 NOTE — Progress Notes (Signed)
PRIMARY CARE AT [facility] [address] [place] [address] [phone]  Date:  07/25/2017   Name:  Monica Gutierrez   DOB:  1955/10/25   MRN:  [id number]  PCP:  Joretta Bachelor, PA    History of Present Illness:  Monica Gutierrez is a 62 y.o. female patient who presents to PCP with  Chief Complaint  Patient presents with  . Medication Refill    hydrodiuril and lipitor     Patient is here for refill of bp medication No adverse side effects to medication. No cp, palpitaitons, sob or leg swellings. She is focusing on vegetables and lean meats.  States that she has some blood in stool. She has had colonoscopy 4 years ago.  Colon polyps with recommendation to follow up in 5 years. She denies straining.  Wt Readings from Last 3 Encounters:  07/25/17 117 lb (53.1 kg)  11/25/16 117 lb 6.4 oz (53.3 kg)  11/01/16 117 lb 3.2 oz (53.2 kg)      Patient Active Problem List   Diagnosis Date Noted  . Rectal mucosa prolapse 11/04/2015  . Essential hypertension, benign 02/16/2012  . Colon polyps 02/16/2012    Past Medical History:  Diagnosis Date  . Hypertension     Past Surgical History:  Procedure Laterality Date  . CESAREAN SECTION    . COLON SURGERY  2010   5 polyps removed 2 years ago  . TUBAL LIGATION      Social History   Tobacco Use  . Smoking status: Former Smoker    Packs/day: 0.25    Years: 15.00    Pack years: 3.75    Types: Cigarettes  . Smokeless tobacco: Never Used  Substance Use Topics  . Alcohol use: Yes    Alcohol/week: 0.6 oz    Types: 1 Cans of beer per week    Comment: one beer on the holidays  . Drug use: No    No family history on file.  No Known Allergies  Medication list has been reviewed and updated.  Current Outpatient Medications on File Prior to Visit  Medication Sig Dispense Refill  . atorvastatin (LIPITOR) 40 MG tablet Take 1 tablet (40 mg total) by mouth daily. 90 tablet 3  . Boswellia-Glucosamine-Vit D (GLUCOSAMINE COMPLEX PO)  Take 1 capsule by mouth daily. Reported on 09/10/2015    . Calcium Carb-Cholecalciferol (CALCIUM + D3 PO) Take by mouth.    . fexofenadine (ALLEGRA) 180 MG tablet Take 180 mg by mouth daily.    . hydrochlorothiazide (HYDRODIURIL) 25 MG tablet TAKE 1 TABLET BY MOUTH ONCE DAILY 30 tablet 0  . hyoscyamine (LEVSIN SL) 0.125 MG SL tablet Place 1 tablet (0.125 mg total) under the tongue every 4 (four) hours as needed. 30 tablet 0  . LORATADINE PO Take by mouth.    . neomycin-polymyxin-hydrocortisone (CORTISPORIN) OTIC solution Place 4 drops into the right ear 3 (three) times daily. 10 mL 0  . omeprazole (PRILOSEC) 20 MG capsule Take 1 capsule (20 mg total) by mouth daily. 30 capsule 0  . ondansetron (ZOFRAN-ODT) 8 MG disintegrating tablet Take 1 tablet (8 mg total) by mouth every 8 (eight) hours as needed for nausea. 20 tablet 0  . OVER THE COUNTER MEDICATION Take 1 tablet by mouth daily. Allergy tablet, pink, $ over the counter at [facility]    . ranitidine (ZANTAC) 150 MG tablet Take 1 tablet (150 mg total) by mouth 2 (two) times daily. 30 tablet 0  . tizanidine (  ZANAFLEX) 2 MG capsule Take 1 capsule (2 mg total) by mouth 3 (three) times daily as needed for muscle spasms. 20 capsule 0  . UNABLE TO FIND Med Name: Zakuror balls    . Vitamin D, Ergocalciferol, (DRISDOL) 50000 units CAPS capsule TAKE 1 CAPSULE BY MOUTH ONCE A WEEK 4 capsule 0  . meloxicam (MOBIC) 7.5 MG tablet TAKE 1 TABLET BY MOUTH ONCE DAILY (Patient not taking: Reported on 07/25/2017) 30 tablet 0   No current facility-administered medications on file prior to visit.     ROS ROS otherwise unremarkable unless listed above.  Physical Examination: BP 106/66   Pulse 88   Temp 98.6 F (37 C) (Oral)   Resp 16   Ht 5' (1.524 m)   Wt 117 lb (53.1 kg)   SpO2 99%   BMI 22.85 kg/m  Ideal Body Weight: Weight in (lb) to have BMI = 25: 127.7  Physical Exam  Constitutional: She is oriented to person, place, and time. She appears  well-developed and well-nourished. No distress.  HENT:  Head: Normocephalic and atraumatic.  Right Ear: External ear normal.  Left Ear: External ear normal.  Eyes: Pupils are equal, round, and reactive to light. Conjunctivae and EOM are normal.  Cardiovascular: Normal rate and regular rhythm. Exam reveals no friction rub.  No murmur heard. Pulmonary/Chest: Effort normal. No respiratory distress. She has no wheezes.  Genitourinary: Rectal exam shows no external hemorrhoid, no internal hemorrhoid and no fissure.  Neurological: She is alert and oriented to person, place, and time.  Skin: She is not diaphoretic.  Psychiatric: She has a normal mood and affect. Her behavior is normal.     Assessment and Plan: Monica Gutierrez is a 62 y.o. female who is here today for cc of  Chief Complaint  Patient presents with  . Medication Refill    hydrodiuril and lipitor  htn stable.  Follow up in 6 months Blood in stool.  Given 3 card for home and will return Will go ahead and proceed with GI follow up.  Referral placed.  Likely need here repeat colonoscopy at this time. Essential hypertension - Plan: Comprehensive metabolic panel, Lipid panel, hydrochlorothiazide (HYDRODIURIL) 25 MG tablet  Hyperlipidemia, unspecified hyperlipidemia type - Plan: Hemoglobin A1c, atorvastatin (LIPITOR) 40 MG tablet  Blood in stool - Plan: POC Hemoccult Bld/Stl (3-Cd Home Screen), Ambulatory referral to Gastroenterology  History of colonic polyps - Plan: Ambulatory referral to Gastroenterology  Seasonal allergies - Plan: cetirizine (ZYRTEC) 10 MG tablet, albuterol (PROVENTIL HFA;VENTOLIN HFA) 108 (90 Base) MCG/ACT inhaler  Ivar Drape, PA-C [facility] 4/24/201912:32 PM

## 2017-07-25 NOTE — Patient Instructions (Addendum)
  Please await contact for gastroenterology.   I am giving you a different allergy pill.  Please stop the allegra, and we will start the claritin.     IF you received an x-ray today, you will receive an invoice from [facility]. Please contact [facility] at [phone] with questions or concerns regarding your invoice.   IF you received labwork today, you will receive an invoice from [place]. Please contact [facility] at [phone] with questions or concerns regarding your invoice.   Our billing staff will not be able to assist you with questions regarding bills from these companies.  You will be contacted with the lab results as soon as they are available. The fastest way to get your results is to activate your My Chart account. Instructions are located on the last page of this paperwork. If you have not heard from [place] regarding the results in 2 weeks, please contact this office.     \

## 2017-07-27 ENCOUNTER — Encounter: Payer: Self-pay | Admitting: Physician Assistant

## 2017-07-27 ENCOUNTER — Encounter: Payer: Self-pay | Admitting: *Deleted

## 2017-09-27 ENCOUNTER — Ambulatory Visit: Payer: BLUE CROSS/BLUE SHIELD | Admitting: Family Medicine

## 2017-09-27 ENCOUNTER — Encounter: Payer: Self-pay | Admitting: Family Medicine

## 2017-09-27 VITALS — BP 120/76 | HR 97 | Ht 60.0 in | Wt 115.4 lb

## 2017-09-27 DIAGNOSIS — R739 Hyperglycemia, unspecified: Secondary | ICD-10-CM | POA: Diagnosis not present

## 2017-09-27 DIAGNOSIS — J301 Allergic rhinitis due to pollen: Secondary | ICD-10-CM

## 2017-09-27 MED ORDER — PREDNISONE 10 MG PO TABS: 10.0000 mg | ORAL_TABLET | Freq: Two times a day (BID) | ORAL | 0 refills | Status: AC

## 2017-09-27 MED ORDER — FLUTICASONE PROPIONATE 50 MCG/ACT NA SUSP: 2.0000 | Freq: Every day | NASAL | 6 refills | Status: DC

## 2017-09-27 NOTE — Patient Instructions (Signed)
Allergies An allergy is when your body reacts to a substance in a way that is not normal. An allergic reaction can happen after you:  Eat something.  Breathe in something.  Touch something.  You can be allergic to:  Things that are only around during certain seasons, like molds and pollens.  Foods.  Drugs.  Insects.  Animal dander.  What are the signs or symptoms?  Puffiness (swelling). This may happen on the lips, face, tongue, mouth, or throat.  Sneezing.  Coughing.  Breathing loudly (wheezing).  Stuffy nose.  Tingling in the mouth.  A rash.  Itching.  Itchy, red, puffy areas of skin (hives).  Watery eyes.  Throwing up (vomiting).  Watery poop (diarrhea).  Dizziness.  Feeling faint or fainting.  Trouble breathing or swallowing.  A tight feeling in the chest.  A fast heartbeat. How is this diagnosed? Allergies can be diagnosed with:  A medical and family history.  Skin tests.  Blood tests.  A food diary. A food diary is a record of all the foods, drinks, and symptoms you have each day.  The results of an elimination diet. This diet involves making sure not to eat certain foods and then seeing what happens when you start eating them again.  How is this treated? There is no cure for allergies, but allergic reactions can be treated with medicine. Severe reactions usually need to be treated at a hospital. How is this prevented? The best way to prevent an allergic reaction is to avoid the thing you are allergic to. Allergy shots and medicines can also help prevent reactions in some cases. This information is not intended to replace advice given to you by your health care provider. Make sure you discuss any questions you have with your health care provider. Document Released: 07/17/2012 Document Revised: 11/17/2015 Document Reviewed: 01/01/2014 Elsevier Interactive Patient Education  2018 [employer]. Prediabetes Eating Plan Prediabetes-also  called impaired glucose tolerance or impaired fasting glucose-is a condition that causes blood sugar (blood glucose) levels to be higher than normal. Following a healthy diet can help to keep prediabetes under control. It can also help to lower the risk of type 2 diabetes and heart disease, which are increased in people who have prediabetes. Along with regular exercise, a healthy diet:  Promotes weight loss.  Helps to control blood sugar levels.  Helps to improve the way that the body uses insulin.  What do I need to know about this eating plan?  Use the glycemic index (GI) to plan your meals. The index tells you how quickly a food will raise your blood sugar. Choose low-GI foods. These foods take a longer time to raise blood sugar.  Pay close attention to the amount of carbohydrates in the food that you eat. Carbohydrates increase blood sugar levels.  Keep track of how many calories you take in. Eating the right amount of calories will help you to achieve a healthy weight. Losing about 7 percent of your starting weight can help to prevent type 2 diabetes.  You may want to follow a Mediterranean diet. This diet includes a lot of vegetables, lean meats or fish, whole grains, fruits, and healthy oils and fats. What foods can I eat? Grains Whole grains, such as whole-wheat or whole-grain breads, crackers, cereals, and pasta. Unsweetened oatmeal. Bulgur. Barley. Quinoa. Brown rice. Corn or whole-wheat flour tortillas or taco shells. Vegetables Lettuce. Spinach. Peas. Beets. Cauliflower. Cabbage. Broccoli. Carrots. Tomatoes. Squash. Eggplant. Herbs. Peppers. Onions. Cucumbers. Brussels sprouts.  Fruits Berries. Bananas. Apples. Oranges. Grapes. Papaya. Mango. Pomegranate. Kiwi. Grapefruit. Cherries. Meats and Other Protein Sources Seafood. Lean meats, such as chicken and [place] or lean cuts of pork and beef. Tofu. Eggs. Nuts. Beans. Dairy Low-fat or fat-free dairy products, such as yogurt,  cottage cheese, and cheese. Beverages Water. Tea. Coffee. Sugar-free or diet soda. Seltzer water. Milk. Milk alternatives, such as soy or almond milk. Condiments Mustard. Relish. Low-fat, low-sugar ketchup. Low-fat, low-sugar barbecue sauce. Low-fat or fat-free mayonnaise. Sweets and Desserts Sugar-free or low-fat pudding. Sugar-free or low-fat ice cream and other frozen treats. Fats and Oils Avocado. Walnuts. Olive oil. The items listed above may not be a complete list of recommended foods or beverages. Contact your dietitian for more options. What foods are not recommended? Grains Refined white flour and flour products, such as bread, pasta, snack foods, and cereals. Beverages Sweetened drinks, such as sweet iced tea and soda. Sweets and Desserts Baked goods, such as cake, cupcakes, pastries, cookies, and cheesecake. The items listed above may not be a complete list of foods and beverages to avoid. Contact your dietitian for more information. This information is not intended to replace advice given to you by your health care provider. Make sure you discuss any questions you have with your health care provider. Document Released: 08/06/2014 Document Revised: 08/28/2015 Document Reviewed: 04/17/2014 Elsevier Interactive Patient Education  2017 [employer].

## 2017-09-27 NOTE — Progress Notes (Signed)
Subjective:  Patient ID: Monica Gutierrez, female    DOB: 1955-12-04  Age: 62 y.o. MRN: [id number]  CC: Establish Care   HPI Monica Gutierrez presents for evaluation of a month long history of postnasal drip.  She has been taking Zyrtec with some relief.  She denies much sneezing, itchy watery eyes, coughing, fever chills, wheezing.  Laboratory review shows that her hemoglobin A1c was slightly elevated with labs drawn back in April.  She is fasting today and requests that I check her fasting blood sugar level.  Outpatient Medications Prior to Visit  Medication Sig Dispense Refill  . atorvastatin (LIPITOR) 40 MG tablet Take 1 tablet (40 mg total) by mouth daily. 90 tablet 3  . Calcium Carb-Cholecalciferol (CALCIUM + D3 PO) Take by mouth.    . cetirizine (ZYRTEC) 10 MG tablet Take 1 tablet (10 mg total) by mouth daily. 30 tablet 11  . hydrochlorothiazide (HYDRODIURIL) 25 MG tablet Take 1 tablet (25 mg total) by mouth daily. 90 tablet 1  . meloxicam (MOBIC) 7.5 MG tablet Take 7.5 mg by mouth daily.    Marland Kitchen albuterol (PROVENTIL HFA;VENTOLIN HFA) 108 (90 Base) MCG/ACT inhaler Inhale 2 puffs into the lungs every 4 (four) hours as needed for wheezing or shortness of breath (cough, shortness of breath or wheezing.). 1 Inhaler 1  . Boswellia-Glucosamine-Vit D (GLUCOSAMINE COMPLEX PO) Take 1 capsule by mouth daily. Reported on 09/10/2015    . fexofenadine (ALLEGRA) 180 MG tablet Take 180 mg by mouth daily.    . hyoscyamine (LEVSIN SL) 0.125 MG SL tablet Place 1 tablet (0.125 mg total) under the tongue every 4 (four) hours as needed. 30 tablet 0  . LORATADINE PO Take by mouth.    . neomycin-polymyxin-hydrocortisone (CORTISPORIN) OTIC solution Place 4 drops into the right ear 3 (three) times daily. 10 mL 0  . omeprazole (PRILOSEC) 20 MG capsule Take 1 capsule (20 mg total) by mouth daily. 30 capsule 0  . OVER THE COUNTER MEDICATION Take 1 tablet by mouth daily. Allergy tablet, pink, $ over the counter at [facility]      . ranitidine (ZANTAC) 150 MG tablet Take 1 tablet (150 mg total) by mouth 2 (two) times daily. 30 tablet 0  . UNABLE TO FIND Med Name: Zakuror balls    . Vitamin D, Ergocalciferol, (DRISDOL) 50000 units CAPS capsule TAKE 1 CAPSULE BY MOUTH ONCE A WEEK 4 capsule 0  . meloxicam (MOBIC) 7.5 MG tablet TAKE 1 TABLET BY MOUTH ONCE DAILY (Patient not taking: Reported on 07/25/2017) 30 tablet 0  . ondansetron (ZOFRAN-ODT) 8 MG disintegrating tablet Take 1 tablet (8 mg total) by mouth every 8 (eight) hours as needed for nausea. 20 tablet 0  . tizanidine (ZANAFLEX) 2 MG capsule Take 1 capsule (2 mg total) by mouth 3 (three) times daily as needed for muscle spasms. 20 capsule 0   No facility-administered medications prior to visit.     ROS Review of Systems  Constitutional: Negative for chills, fatigue, fever and unexpected weight change.  HENT: Positive for congestion, postnasal drip and rhinorrhea. Negative for ear pain, hearing loss, sinus pressure, sinus pain, sneezing and trouble swallowing.   Eyes: Negative for photophobia and visual disturbance.  Respiratory: Negative for cough and wheezing.   Cardiovascular: Negative.   Gastrointestinal: Negative.   Endocrine: Negative for polyphagia and polyuria.  Genitourinary: Negative for difficulty urinating and frequency.  Musculoskeletal: Negative for gait problem and joint swelling.  Skin: Negative for pallor and rash.  Allergic/Immunologic: Negative for immunocompromised state.  Neurological: Negative for headaches.  Hematological: Does not bruise/bleed easily.  Psychiatric/Behavioral: Negative.   All other systems reviewed and are negative.   Objective:  BP 120/76   Pulse 97   Ht 5' (1.524 m)   Wt 115 lb 6 oz (52.3 kg)   SpO2 95%   BMI 22.53 kg/m   BP Readings from Last 3 Encounters:  09/27/17 120/76  07/25/17 106/66  11/25/16 111/69    Wt Readings from Last 3 Encounters:  09/27/17 115 lb 6 oz (52.3 kg)  07/25/17 117 lb (53.1 kg)   11/25/16 117 lb 6.4 oz (53.3 kg)    Physical Exam  Constitutional: She is oriented to person, place, and time. She appears well-developed and well-nourished. No distress.  HENT:  Head: Normocephalic and atraumatic.  Right Ear: External ear normal.  Left Ear: External ear normal.  Mouth/Throat: Oropharynx is clear and moist. No oropharyngeal exudate.  Eyes: Pupils are equal, round, and reactive to light. Conjunctivae and EOM are normal. Right eye exhibits no discharge. Left eye exhibits no discharge. No scleral icterus.  Neck: Normal range of motion. Neck supple. No JVD present. No tracheal deviation present. No thyromegaly present.  Cardiovascular: Normal rate, regular rhythm and normal heart sounds.  Pulmonary/Chest: Effort normal and breath sounds normal.  Abdominal: Soft. Bowel sounds are normal. She exhibits no distension and no mass. There is no tenderness. There is no rebound and no guarding. No hernia.  Lymphadenopathy:    She has no cervical adenopathy.  Neurological: She is alert and oriented to person, place, and time.  Skin: She is not diaphoretic.  Psychiatric: She has a normal mood and affect. Her behavior is normal.    Lab Results  Component Value Date   WBC 12.6 (A) 11/25/2016   HGB 13.2 11/25/2016   HCT 39.1 11/25/2016   PLT 204 09/10/2015   GLUCOSE 100 (H) 07/25/2017   CHOL 158 07/25/2017   TRIG 315 (H) 07/25/2017   HDL 40 07/25/2017   LDLCALC 55 07/25/2017   ALT 24 07/25/2017   AST 27 07/25/2017   NA 139 07/25/2017   K 3.6 07/25/2017   CL 97 07/25/2017   CREATININE 0.67 07/25/2017   BUN 12 07/25/2017   CO2 27 07/25/2017   TSH 1.02 09/10/2015   HGBA1C 5.9 (H) 07/25/2017    Dg Epidurography  Result Date: 01/16/2015 CLINICAL DATA:  Lumbosacral spondylosis without myelopathy. Left-sided low back pain and left lower extremity radicular pain. Complete pain relief from prior L2-3 interlaminar injection in 02/2014 with pain returning 1 month ago.  FLUOROSCOPY TIME:  Radiation Exposure Index (as provided by the fluoroscopic device): 0.0454 Gy*cm^2 PROCEDURE: The procedure, risks, benefits, and alternatives were explained to the patient. Questions regarding the procedure were encouraged and answered. The patient understands and consents to the procedure. LUMBAR EPIDURAL INJECTION: An interlaminar approach was performed on the left at L2-3. The overlying skin was cleansed and anesthetized. A 3.5 inch, 20 gauge spinal needle was advanced using loss-of-resistance technique. DIAGNOSTIC EPIDURAL INJECTION: Injection of Omnipaque 180 shows a good epidural pattern with spread above and below the level of needle placement, primarily on the left. No vascular opacification is seen. THERAPEUTIC EPIDURAL INJECTION: 120 mg of Depo-Medrol mixed with 3 mL of 1% lidocaine were instilled. The procedure was well-tolerated, and the patient was discharged thirty minutes following the injection in good condition. COMPLICATIONS: None IMPRESSION: Technically successful lumbar interlaminar epidural injection on the left at L2-3. Electronically Signed  By: Logan Bores M.D.   On: 01/16/2015 16:06    Assessment & Plan:   Rasheeda was seen today for establish care.  Diagnoses and all orders for this visit:  Allergic rhinitis due to pollen, unspecified seasonality -     fluticasone (FLONASE) 50 MCG/ACT nasal spray; Place 2 sprays into both nostrils daily. -     predniSONE (DELTASONE) 10 MG tablet; Take 1 tablet (10 mg total) by mouth 2 (two) times daily with a meal for 7 days.  Blood glucose elevated   I have discontinued Julina K. Brodhead's Boswellia-Glucosamine-Vit D (GLUCOSAMINE COMPLEX PO), OVER THE COUNTER MEDICATION, UNABLE TO FIND, LORATADINE PO, tizanidine, omeprazole, fexofenadine, ranitidine, neomycin-polymyxin-hydrocortisone, hyoscyamine, ondansetron, Vitamin D (Ergocalciferol), and albuterol. I am also having her start on fluticasone and predniSONE. Additionally, I am  having her maintain her Calcium Carb-Cholecalciferol (CALCIUM + D3 PO), hydrochlorothiazide, atorvastatin, cetirizine, and meloxicam.  Meds ordered this encounter  Medications  . fluticasone (FLONASE) 50 MCG/ACT nasal spray    Sig: Place 2 sprays into both nostrils daily.    Dispense:  16 g    Refill:  6  . predniSONE (DELTASONE) 10 MG tablet    Sig: Take 1 tablet (10 mg total) by mouth 2 (two) times daily with a meal for 7 days.    Dispense:  14 tablet    Refill:  0   Will add Flonase and a short prednisone burst for her allergy symptoms.  Gave her anticipatory guidance of predicted about prediabetes and suggestions for her her diet.  She will follow-up in 2 months to have her hemoglobin A1c redrawn.  I asked her to be sure to be fasting at that time as her triglycerides have been elevated with the last lipid profile.  Follow-up: Return in about 2 months (around 11/27/2017).  Libby Maw, MD

## 2017-10-04 ENCOUNTER — Telehealth: Payer: Self-pay | Admitting: Family Medicine

## 2017-10-04 DIAGNOSIS — J301 Allergic rhinitis due to pollen: Secondary | ICD-10-CM

## 2017-10-04 NOTE — Telephone Encounter (Signed)
Ms. Monica Gutierrez can in the office and request a  refill for Prednisone 10mg .  She would like for it to be sent to [facility] on [address].   Thank

## 2017-10-04 NOTE — Telephone Encounter (Signed)
I tried to contact patient, but unable to leave a voicemail. Patient needs to be using Flonase. Rx refill for Prednisone denied. Okay for triage to go over with patient. If patient is not better, she will have to see Dr. Ethelene Hal again.

## 2017-10-04 NOTE — Telephone Encounter (Signed)
Patient should be using flonase.

## 2017-10-04 NOTE — Telephone Encounter (Signed)
Rx refill denied to pharmacy.

## 2017-11-10 ENCOUNTER — Other Ambulatory Visit: Payer: Self-pay

## 2017-11-10 ENCOUNTER — Encounter: Payer: Self-pay | Admitting: Physician Assistant

## 2017-11-10 ENCOUNTER — Ambulatory Visit: Payer: BLUE CROSS/BLUE SHIELD | Admitting: Physician Assistant

## 2017-11-10 VITALS — BP 108/60 | HR 89 | Temp 98.0°F | Resp 18 | Ht 60.24 in | Wt 117.0 lb

## 2017-11-10 DIAGNOSIS — L853 Xerosis cutis: Secondary | ICD-10-CM

## 2017-11-10 DIAGNOSIS — I10 Essential (primary) hypertension: Secondary | ICD-10-CM | POA: Diagnosis not present

## 2017-11-10 MED ORDER — HYDROCHLOROTHIAZIDE 25 MG PO TABS: 25.0000 mg | ORAL_TABLET | Freq: Every day | ORAL | 1 refills | Status: DC

## 2017-11-10 MED ORDER — TRIAMCINOLONE ACETONIDE 0.5 % EX CREA: 1.0000 "application " | TOPICAL_CREAM | Freq: Two times a day (BID) | CUTANEOUS | 0 refills | Status: DC

## 2017-11-10 NOTE — Progress Notes (Signed)
Monica Gutierrez  MRN: [id number] DOB: Mar 21, 1956  PCP: Patient, No Pcp Per  Chief Complaint  Patient presents with  . Foot Problem    X 5 mth- both feet dry dark spot on top side of feet  . Medication Refill     hydrochlorothiazide    Subjective:  Pt presents to clinic for skin on both of her feet at her lateral ankles that is darkened and scaly.  It itches so she scratches it.  She has had it for 5 months has not really noticed much change other than it getting larger.  She also needs refills of her blood pressure medication she just got her last 90-day refill yesterday.  She is doing well on her blood pressure medication she does not check her blood pressure at home.  History is obtained by patient.  Review of Systems  Constitutional: Negative for chills and fever.  Eyes: Negative for visual disturbance.  Respiratory: Negative for shortness of breath.   Cardiovascular: Negative for chest pain, palpitations and leg swelling.  Skin: Positive for rash.  Neurological: Negative for dizziness, light-headedness and headaches.    Patient Active Problem List   Diagnosis Date Noted  . Allergic rhinitis due to pollen 09/27/2017  . Blood glucose elevated 09/27/2017  . Rectal mucosa prolapse 11/04/2015  . Essential hypertension, benign 02/16/2012  . Colon polyps 02/16/2012    Current Outpatient Medications on File Prior to Visit  Medication Sig Dispense Refill  . atorvastatin (LIPITOR) 40 MG tablet Take 1 tablet (40 mg total) by mouth daily. 90 tablet 3  . Calcium Carb-Cholecalciferol (CALCIUM + D3 PO) Take by mouth.    . cetirizine (ZYRTEC) 10 MG tablet Take 1 tablet (10 mg total) by mouth daily. 30 tablet 11  . fluticasone (FLONASE) 50 MCG/ACT nasal spray Place 2 sprays into both nostrils daily. 16 g 6  . meloxicam (MOBIC) 7.5 MG tablet Take 7.5 mg by mouth daily.     No current facility-administered medications on file prior to visit.     No Known Allergies  Past Medical  History:  Diagnosis Date  . Hypertension    Social History   Social History Narrative   Lives in [place] with husband.  Children are 21, 19, and 14.  All live at home.   Social History   Tobacco Use  . Smoking status: Former Smoker    Packs/day: 0.25    Years: 15.00    Pack years: 3.75    Types: Cigarettes  . Smokeless tobacco: Never Used  Substance Use Topics  . Alcohol use: Yes    Alcohol/week: 1.0 standard drinks    Types: 1 Cans of beer per week    Comment: one beer on the holidays  . Drug use: No   family history is not on file.     Objective:  BP 108/60 (BP Location: Right Arm, Patient Position: Sitting, Cuff Size: Normal)   Pulse 89   Temp 98 F (36.7 C) (Oral)   Resp 18   Ht 5' 0.24" (1.53 m)   Wt 117 lb (53.1 kg)   SpO2 96%   BMI 22.67 kg/m  Body mass index is 22.67 kg/m.  Wt Readings from Last 3 Encounters:  11/10/17 117 lb (53.1 kg)  09/27/17 115 lb 6 oz (52.3 kg)  07/25/17 117 lb (53.1 kg)    Physical Exam  Constitutional: She is oriented to person, place, and time. She appears well-developed and well-nourished.  HENT:  Head: Normocephalic and  atraumatic.  Right Ear: Hearing and external ear normal.  Left Ear: Hearing and external ear normal.  Eyes: Conjunctivae are normal.  Neck: Normal range of motion.  Cardiovascular: Normal rate, regular rhythm and normal heart sounds.  No murmur heard. Pulmonary/Chest: Effort normal and breath sounds normal.  Musculoskeletal:       Right lower leg: She exhibits no edema.       Left lower leg: She exhibits no edema.  Neurological: She is alert and oriented to person, place, and time.  Skin: Skin is warm and dry.  2 spots of hyper trophic skin with scaling that has no erythema.  Consistent with dry skin.  Psychiatric: She has a normal mood and affect. Her behavior is normal. Judgment and thought content normal.  Vitals reviewed.   Assessment and Plan :  Dry skin - Plan: triamcinolone cream  (KENALOG) 0.5 % -use this twice a day for dry skin  Essential hypertension - Plan: hydrochlorothiazide (HYDRODIURIL) 25 MG tablet -continue blood pressure medications recheck in 6 months for lab work.  Patient verbalized to me that they understand the following: diagnosis, what is being done for them, what to expect and what should be done at home.  Their questions have been answered.  See after visit summary for patient specific instructions.  Windell Hummingbird PA-C  [facility] 11/10/2017 3:12 PM  Please note: Portions of this report may have been transcribed using dragon voice recognition software. Every effort was made to ensure accuracy; however, inadvertent computerized transcription errors may be present.

## 2017-11-10 NOTE — Patient Instructions (Addendum)
Put the cream on the rash on your foot 2x/day to help with the dry skin  Recheck in 6 months for your BP and blood work    IF you received an x-ray today, you will receive an invoice from [facility] Rad[facility]. Please contact [facility] at [phone] with questions or concerns regarding your invoice.   IF you received labwork today, you will receive an invoice from [facility]. Please contact [facility] at [phone] with questions or concerns regarding your invoice.   Our billing staff will not be able to assist you with questions regarding bills from these companies.  You will be contacted with the lab results as soon as they are available. The fastest way to get your results is to activate your My Chart account. Instructions are located on the last page of this paperwork. If you have not heard from [place] regarding the results in 2 weeks, please contact this office.

## 2017-11-11 ENCOUNTER — Telehealth: Payer: Self-pay | Admitting: Physician Assistant

## 2017-11-11 NOTE — Telephone Encounter (Signed)
Copied from [place] [id number]. Topic: Quick Communication - See Telephone Encounter >> Nov 11, 2017 11:57 AM Ahmed Prima L wrote: CRM for notification. See Telephone encounter for: 11/11/17.  Patient states she was in the office yesterday and saw Windell Hummingbird. The prescription that she sent to [facility] was not received by them ( per [employer] ) can that be re-sent?  triamcinolone cream (KENALOG) 0.5 %

## 2017-11-15 ENCOUNTER — Other Ambulatory Visit: Payer: Self-pay | Admitting: *Deleted

## 2017-11-15 DIAGNOSIS — L853 Xerosis cutis: Secondary | ICD-10-CM

## 2017-11-15 MED ORDER — TRIAMCINOLONE ACETONIDE 0.5 % EX CREA: 1.0000 "application " | TOPICAL_CREAM | Freq: Two times a day (BID) | CUTANEOUS | 0 refills | Status: AC

## 2017-12-12 ENCOUNTER — Other Ambulatory Visit: Payer: Self-pay | Admitting: Physician Assistant

## 2017-12-12 DIAGNOSIS — L853 Xerosis cutis: Secondary | ICD-10-CM

## 2017-12-19 ENCOUNTER — Ambulatory Visit: Payer: BLUE CROSS/BLUE SHIELD | Admitting: Family Medicine

## 2017-12-19 ENCOUNTER — Encounter: Payer: Self-pay | Admitting: Family Medicine

## 2017-12-19 ENCOUNTER — Other Ambulatory Visit: Payer: Self-pay

## 2017-12-19 ENCOUNTER — Ambulatory Visit: Payer: Self-pay | Admitting: General Practice

## 2017-12-19 VITALS — BP 144/82 | HR 76 | Temp 98.3°F | Resp 17 | Ht 60.24 in | Wt 115.2 lb

## 2017-12-19 DIAGNOSIS — N8110 Cystocele, unspecified: Secondary | ICD-10-CM

## 2017-12-19 NOTE — Progress Notes (Signed)
Chief Complaint  Patient presents with  . feels like air going in vagina when walking.  Did kagling ex    ongoing x 10 yrs    HPI  Patient reports that she has been having a sensation in her vagina like air rushing in and it is very comfortable She reports that she was told by Gynecology to do kegels She had birthed 5 children and reports that one was by c-section.  She also had 3 miscarriages She states that she feels like something is rubbing in her vagina It is worse if she stands, lifts anything or walks around She denies dysuria or vaginal discharge She has not had sex in six years   Past Medical History:  Diagnosis Date  . Hypertension     Current Outpatient Medications  Medication Sig Dispense Refill  . atorvastatin (LIPITOR) 40 MG tablet Take 1 tablet (40 mg total) by mouth daily. 90 tablet 3  . Calcium Carb-Cholecalciferol (CALCIUM + D3 PO) Take by mouth.    . cetirizine (ZYRTEC) 10 MG tablet Take 1 tablet (10 mg total) by mouth daily. 30 tablet 11  . fluticasone (FLONASE) 50 MCG/ACT nasal spray Place 2 sprays into both nostrils daily. 16 g 6  . hydrochlorothiazide (HYDRODIURIL) 25 MG tablet Take 1 tablet (25 mg total) by mouth daily. 90 tablet 1  . meloxicam (MOBIC) 7.5 MG tablet Take 7.5 mg by mouth daily.    Marland Kitchen triamcinolone cream (KENALOG) 0.5 % Apply 1 application topically 2 (two) times daily. 30 g 0  . triamcinolone cream (KENALOG) 0.5 % APPLY  CREAM TOPICALLY TWICE DAILY 30 g 1   No current facility-administered medications for this visit.     Allergies: No Known Allergies  Past Surgical History:  Procedure Laterality Date  . CESAREAN SECTION    . COLON SURGERY  2010   5 polyps removed 2 years ago  . TUBAL LIGATION      Social History   Socioeconomic History  . Marital status: Married    Spouse name: Not on file  . Number of children: 4  . Years of education: Not on file  . Highest education level: Not on file  Occupational History  . Not on  file  Social Needs  . Financial resource strain: Not on file  . Food insecurity:    Worry: Not on file    Inability: Not on file  . Transportation needs:    Medical: Not on file    Non-medical: Not on file  Tobacco Use  . Smoking status: Former Smoker    Packs/day: 0.25    Years: 15.00    Pack years: 3.75    Types: Cigarettes  . Smokeless tobacco: Never Used  Substance and Sexual Activity  . Alcohol use: Yes    Alcohol/week: 1.0 standard drinks    Types: 1 Cans of beer per week    Comment: one beer on the holidays  . Drug use: No  . Sexual activity: Yes    Birth control/protection: Surgical  Lifestyle  . Physical activity:    Days per week: Not on file    Minutes per session: Not on file  . Stress: Not on file  Relationships  . Social connections:    Talks on phone: Not on file    Gets together: Not on file    Attends religious service: Not on file    Active member of club or organization: Not on file    Attends meetings of clubs or  organizations: Not on file    Relationship status: Not on file  Other Topics Concern  . Not on file  Social History Narrative   Lives in [place] with husband.  Children are 21, 19, and 14.  All live at home.    No family history on file.   ROS Review of Systems See HPI Constitution: No fevers or chills No malaise No diaphoresis Skin: No rash or itching Eyes: no blurry vision, no double vision GU: no dysuria or hematuria Neuro: no dizziness or headaches all others reviewed and negative   Objective: Vitals:   12/19/17 1203  BP: (!) 144/82  Pulse: 76  Resp: 17  Temp: 98.3 F (36.8 C)  TempSrc: Oral  SpO2: 96%  Weight: 115 lb 3.2 oz (52.3 kg)  Height: 5' 0.24" (1.53 m)    Physical Exam  Constitutional: She is oriented to person, place, and time. She appears well-developed and well-nourished.  HENT:  Head: Normocephalic and atraumatic.  Eyes: Conjunctivae and EOM are normal.  Pulmonary/Chest: Effort normal.    Neurological: She is alert and oriented to person, place, and time.    Chaperone present External genitalia within normal limit Mucosa appear dry Urethral meatus normal Bimanual exam: With valsalva bladder and uterus filled the vaginal vault   Assessment and Plan Belanna was seen today for feels like air going in vagina when walking.  did kagling ex.  Diagnoses and all orders for this visit:  Pelvic organ prolapse quantification stage 2 cystocele -     Ambulatory referral to Urogynecology  -  Discussed options for treatment Discussed risk factors for multigravid women Patient eager to pursue referral to specialist   Winner Valeriano A Nolon Rod
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
## 2017-12-19 NOTE — Patient Instructions (Addendum)
If you have lab work done today you will be contacted with your lab results within the next 2 weeks.  If you have not heard from [place] then please contact us. The fastest way to get your results is to register for My Chart.   IF you received an x-ray today, you will receive an invoice from [facility] Radiology. Please contact [facility] Radiology at [phone] with questions or concerns regarding your invoice.   IF you received labwork today, you will receive an invoice from [facility]. Please contact [facility] at [phone] with questions or concerns regarding your invoice.   Our billing staff will not be able to assist you with questions regarding bills from these companies.  You will be contacted with the lab results as soon as they are available. The fastest way to get your results is to activate your My Chart account. Instructions are located on the last page of this paperwork. If you have not heard from [place] regarding the results in 2 weeks, please contact this office.     Pelvic Organ Prolapse Pelvic organ prolapse is the stretching, bulging, or dropping of pelvic organs into an abnormal position. It happens when the muscles and tissues that surround and support pelvic structures are stretched or weak. Pelvic organ prolapse can involve:  Vagina (vaginal prolapse).  Uterus (uterine prolapse).  Bladder (cystocele).  Rectum (rectocele).  Intestines (enterocele).  When organs other than the vagina are involved, they often bulge into the vagina or protrude from the vagina, depending on how severe the prolapse is. What are the causes? Causes of this condition include:  Pregnancy, labor, and childbirth.  Long-lasting (chronic) cough.  Chronic constipation.  Obesity.  Past pelvic surgery.  Aging. During and after menopause, a decreased production of the hormone estrogen can weaken pelvic ligaments and muscles.  Consistently lifting more than 50 lb (23 kg).  Buildup of  fluid in the abdomen due to certain diseases and other conditions.  What are the signs or symptoms? Symptoms of this condition include:  Loss of bladder control when you cough, sneeze, strain, and exercise (stress incontinence). This may be worse immediately following childbirth, and it may gradually improve over time.  Feeling pressure in your pelvis or vagina. This pressure may increase when you cough or when you are having a bowel movement.  A bulge that protrudes from the opening of your vagina or against your vaginal wall. If your uterus protrudes through the opening of your vagina and rubs against your clothing, you may also experience soreness, ulcers, infection, pain, and bleeding.  Increased effort to have a bowel movement or urinate.  Pain in your low back.  Pain, discomfort, or disinterest in sexual intercourse.  Repeated bladder infections (urinary tract infections).  Difficulty inserting or inability to insert a tampon or applicator.  In some people, this condition does not cause any symptoms. How is this diagnosed? Your health care provider may perform an internal and external vaginal and rectal exam. During the exam, you may be asked to cough and strain while you are lying down, sitting, and standing up. Your health care provider will determine if other tests are required, such as bladder function tests. How is this treated? In most cases, this condition needs to be treated only if it produces symptoms. No treatment is guaranteed to correct the prolapse or relieve the symptoms completely. Treatment may include:  Lifestyle changes, such as: ? Avoiding drinking beverages that contain caffeine. ? Increasing your intake of high-fiber  foods. This can help to decrease constipation and straining during bowel movements. ? Emptying your bladder at scheduled times (bladder training therapy). This can help to reduce or avoid urinary incontinence. ? Losing weight if you are  overweight or obese.  Estrogen. Estrogen may help mild prolapse by increasing the strength and tone of pelvic floor muscles.  Kegel exercises. These may help mild cases of prolapse by strengthening and tightening the muscles of the pelvic floor.  Pessary insertion. A pessary is a soft, flexible device that is placed into your vagina by your health care provider to help support the vaginal walls and keep pelvic organs in place.  Surgery. This is often the only form of treatment for severe prolapse. Different types of surgeries are available.  Follow these instructions at home:  Wear a sanitary pad or absorbent product if you have urinary incontinence.  Avoid heavy lifting and straining with exercise and work. Do not hold your breath when you perform mild to moderate lifting and exercise activities. Limit your activities as directed by your health care provider.  Take medicines only as directed by your health care provider.  Perform Kegel exercises as directed by your health care provider.  If you have a pessary, take care of it as directed by your health care provider. Contact a health care provider if:  Your symptoms interfere with your daily activities or sex life.  You need medicine to help with the discomfort.  You notice bleeding from the vagina that is not related to your period.  You have a fever.  You have pain or bleeding when you urinate.  You have bleeding when you have a bowel movement.  You lose urine when you have sex.  You have chronic constipation.  You have a pessary that falls out.  You have vaginal discharge that has a bad smell.  You have low abdominal pain or cramping that is unusual for you. This information is not intended to replace advice given to you by your health care provider. Make sure you discuss any questions you have with your health care provider. Document Released: 10/17/2013 Document Revised: 08/28/2015 Document Reviewed:  06/04/2013 Elsevier Interactive Patient Education  [employer].

## 2017-12-29 ENCOUNTER — Encounter: Payer: Self-pay | Admitting: Family Medicine

## 2017-12-29 ENCOUNTER — Ambulatory Visit: Payer: BLUE CROSS/BLUE SHIELD | Admitting: Family Medicine

## 2017-12-29 ENCOUNTER — Other Ambulatory Visit: Payer: Self-pay

## 2017-12-29 VITALS — BP 109/73 | HR 68 | Temp 98.1°F | Ht 60.0 in | Wt 113.0 lb

## 2017-12-29 DIAGNOSIS — R079 Chest pain, unspecified: Secondary | ICD-10-CM | POA: Diagnosis not present

## 2017-12-29 DIAGNOSIS — R12 Heartburn: Secondary | ICD-10-CM

## 2017-12-29 DIAGNOSIS — R1013 Epigastric pain: Secondary | ICD-10-CM | POA: Diagnosis not present

## 2017-12-29 DIAGNOSIS — R112 Nausea with vomiting, unspecified: Secondary | ICD-10-CM | POA: Diagnosis not present

## 2017-12-29 MED ORDER — OMEPRAZOLE 20 MG PO CPDR: 20.0000 mg | DELAYED_RELEASE_CAPSULE | Freq: Every day | ORAL | 1 refills | Status: DC

## 2017-12-29 NOTE — Patient Instructions (Addendum)
Chest symptoms are likely due to heartburn.  Start omeprazole once in the morning, okay to continue Zantac at bedtime.  See foods to avoid below and other information.  I will refer you back to Dr. Benson [place] for further evaluation.   If any worsening chest pain, proceed to the emergency room, but at this time that appears to be due to your heartburn.  Return to the clinic or go to the nearest emergency room if any of your symptoms worsen or new symptoms occur.   Food Choices for Gastroesophageal Reflux Disease, Adult When you have gastroesophageal reflux disease (GERD), the foods you eat and your eating habits are very important. Choosing the right foods can help ease your discomfort. What guidelines do I need to follow?  Choose fruits, vegetables, whole grains, and low-fat dairy products.  Choose low-fat meat, fish, and poultry.  Limit fats such as oils, salad dressings, butter, nuts, and avocado.  Keep a food diary. This helps you identify foods that cause symptoms.  Avoid foods that cause symptoms. These may be different for everyone.  Eat small meals often instead of 3 large meals a day.  Eat your meals slowly, in a place where you are relaxed.  Limit fried foods.  Cook foods using methods other than frying.  Avoid drinking alcohol.  Avoid drinking large amounts of liquids with your meals.  Avoid bending over or lying down until 2-3 hours after eating. What foods are not recommended? These are some foods and drinks that may make your symptoms worse: Vegetables Tomatoes. Tomato juice. Tomato and spaghetti sauce. Chili peppers. Onion and garlic. Horseradish. Fruits Oranges, grapefruit, and lemon (fruit and juice). Meats High-fat meats, fish, and poultry. This includes hot dogs, ribs, ham, sausage, salami, and bacon. Dairy Whole milk and chocolate milk. Sour cream. Cream. Butter. Ice cream. Cream cheese. Drinks Coffee and tea. Bubbly (carbonated) drinks or energy  drinks. Condiments Hot sauce. Barbecue sauce. Sweets/Desserts Chocolate and cocoa. Donuts. Peppermint and spearmint. Fats and Oils High-fat foods. This includes [place] fries and potato chips. Other Vinegar. Strong spices. This includes black pepper, white pepper, red pepper, cayenne, curry powder, cloves, ginger, and chili powder. The items listed above may not be a complete list of foods and drinks to avoid. Contact your dietitian for more information. This information is not intended to replace advice given to you by your health care provider. Make sure you discuss any questions you have with your health care provider. Document Released: 09/21/2011 Document Revised: 08/28/2015 Document Reviewed: 01/24/2013 Elsevier Interactive Patient Education  2017 [employer]  Nonspecific Chest Pain Chest pain can be caused by many different conditions. There is always a chance that your pain could be related to something serious, such as a heart attack or a blood clot in your lungs. Chest pain can also be caused by conditions that are not life-threatening. If you have chest pain, it is very important to follow up with your health care provider. What are the causes? Causes of this condition include:  Heartburn.  Pneumonia or bronchitis.  Anxiety or stress.  Inflammation around your heart (pericarditis) or lung (pleuritis or pleurisy).  A blood clot in your lung.  A collapsed lung (pneumothorax). This can develop suddenly on its own (spontaneous pneumothorax) or from trauma to the chest.  Shingles infection (varicella-zoster virus).  Heart attack.  Damage to the bones, muscles, and cartilage that make up your chest wall. This can include: ? Bruised bones due to injury. ? Strained muscles or cartilage  due to frequent or repeated coughing or overwork. ? Fracture to one or more ribs. ? Sore cartilage due to inflammation (costochondritis).  What increases the risk? Risk factors for this  condition may include:  Activities that increase your risk for trauma or injury to your chest.  Respiratory infections or conditions that cause frequent coughing.  Medical conditions or overeating that can cause heartburn.  Heart disease or family history of heart disease.  Conditions or health behaviors that increase your risk of developing a blood clot.  Having had chicken pox (varicella zoster).  What are the signs or symptoms? Chest pain can feel like:  Burning or tingling on the surface of your chest or deep in your chest.  Crushing, pressure, aching, or squeezing pain.  Dull or sharp pain that is worse when you move, cough, or take a deep breath.  Pain that is also felt in your back, neck, shoulder, or arm, or pain that spreads to any of these areas.  Your chest pain may come and go, or it may stay constant. How is this diagnosed? Lab tests or other studies may be needed to find the cause of your pain. Your health care provider may have you take a test called an ECG (electrocardiogram). An ECG records your heartbeat patterns at the time the test is performed. You may also have other tests, such as:  Transthoracic echocardiogram (TTE). In this test, sound waves are used to create a picture of the heart structures and to look at how blood flows through your heart.  Transesophageal echocardiogram (TEE).This is a more advanced imaging test that takes images from inside your body. It allows your health care provider to see your heart in finer detail.  Cardiac monitoring. This allows your health care provider to monitor your heart rate and rhythm in real time.  Holter monitor. This is a portable device that records your heartbeat and can help to diagnose abnormal heartbeats. It allows your health care provider to track your heart activity for several days, if needed.  Stress tests. These can be done through exercise or by taking medicine that makes your heart beat more  quickly.  Blood tests.  Other imaging tests.  How is this treated? Treatment depends on what is causing your chest pain. Treatment may include:  Medicines. These may include: ? Acid blockers for heartburn. ? Anti-inflammatory medicine. ? Pain medicine for inflammatory conditions. ? Antibiotic medicine, if an infection is present. ? Medicines to dissolve blood clots. ? Medicines to treat coronary artery disease (CAD).  Supportive care for conditions that do not require medicines. This may include: ? Resting. ? Applying heat or cold packs to injured areas. ? Limiting activities until pain decreases.  Follow these instructions at home: Medicines  If you were prescribed an antibiotic, take it as told by your health care provider. Do not stop taking the antibiotic even if you start to feel better.  Take over-the-counter and prescription medicines only as told by your health care provider. Lifestyle  Do not use any products that contain nicotine or tobacco, such as cigarettes and e-cigarettes. If you need help quitting, ask your health care provider.  Do not drink alcohol.  Make lifestyle changes as directed by your health care provider. These may include: ? Getting regular exercise. Ask your health care provider to suggest some activities that are safe for you. ? Eating a heart-healthy diet. A registered dietitian can help you to learn healthy eating options. ? Maintaining a healthy weight. ?  Managing diabetes, if necessary. ? Reducing stress, such as with yoga or relaxation techniques. General instructions  Avoid any activities that bring on chest pain.  If heartburn is the cause for your chest pain, raise (elevate) the head of your bed about 6 inches (15 cm) by putting blocks under the legs. Sleeping with more pillows does not effectively relieve heartburn because it only changes the position of your head.  Keep all follow-up visits as told by your health care provider.  This is important. This includes any further testing if your chest pain does not go away. Contact a health care provider if:  Your chest pain does not go away.  You have a rash with blisters on your chest.  You have a fever.  You have chills. Get help right away if:  Your chest pain is worse.  You have a cough that gets worse, or you cough up blood.  You have severe pain in your abdomen.  You have severe weakness.  You faint.  You have sudden, unexplained chest discomfort.  You have sudden, unexplained discomfort in your arms, back, neck, or jaw.  You have shortness of breath at any time.  You suddenly start to sweat, or your skin gets clammy.  You feel nauseous or you vomit.  You suddenly feel light-headed or dizzy.  Your heart begins to beat quickly, or it feels like it is skipping beats. These symptoms may represent a serious problem that is an emergency. Do not wait to see if the symptoms will go away. Get medical help right away. Call your local emergency services (911 in the [place] Do not drive yourself to the hospital. This information is not intended to replace advice given to you by your health care provider. Make sure you discuss any questions you have with your health care provider. Document Released: 12/30/2004 Document Revised: 12/15/2015 Document Reviewed: 12/15/2015 Elsevier Interactive Patient Education  [employer].   If you have lab work done today you will be contacted with your lab results within the next 2 weeks.  If you have not heard from [place] then please contact us. The fastest way to get your results is to register for My Chart.   IF you received an x-ray today, you will receive an invoice from [facility]. Please contact [facility] at [phone] with questions or concerns regarding your invoice.   IF you received labwork today, you will receive an invoice from [place]. Please contact [facility] at [phone] with  questions or concerns regarding your invoice.   Our billing staff will not be able to assist you with questions regarding bills from these companies.  You will be contacted with the lab results as soon as they are available. The fastest way to get your results is to activate your My Chart account. Instructions are located on the last page of this paperwork. If you have not heard from [place] regarding the results in 2 weeks, please contact this office.

## 2017-12-29 NOTE — Progress Notes (Addendum)
Subjective:  By signing my name below, I, Essence Howell, attest that this documentation has been prepared under the direction and in the presence of Wendie Agreste, MD Electronically Signed: Ladene Artist, ED Scribe 12/29/2017 at 10:30 AM.   Patient ID: Monica Gutierrez, female    DOB: 03-20-1956, 62 y.o.   MRN: [id number]  Chief Complaint  Patient presents with  . Gastroesophageal Reflux    going on for years, sick in the morning    HPI Monica Gutierrez is a 62 y.o. female who presents to Primary Care at [facility] complaining of heart burn symptoms. Seen in 2017 with reflux symptoms, nausea after eating, belching/burping at night. She was taking zantac with moderate relief. Started Omeprazole 40 mg qd. EGD 08/14/13 Dr. Benson Norway, normal esophagus, stomach and duodenum. Normal LFTs in April.  Pt reports a hot/burning and tight sensation in her central chest during the middle of the night x 1 yr which improves with drinking water. Also reports sweating at night with hot flashes, nausea and vomiting once every morning, some blood in stools noticed a few months ago that has since resolved. She takes Zantac almost every night when she has symptoms and has tried [employer] as well. Denies symptoms during the day, cp, fever, weight loss.  Patient Active Problem List   Diagnosis Date Noted  . Allergic rhinitis due to pollen 09/27/2017  . Blood glucose elevated 09/27/2017  . Rectal mucosa prolapse 11/04/2015  . Essential hypertension, benign 02/16/2012  . Colon polyps 02/16/2012   Past Medical History:  Diagnosis Date  . Hypertension    Past Surgical History:  Procedure Laterality Date  . CESAREAN SECTION    . COLON SURGERY  2010   5 polyps removed 2 years ago  . TUBAL LIGATION     No Known Allergies Prior to Admission medications   Medication Sig Start Date End Date Taking? Authorizing Provider  atorvastatin (LIPITOR) 40 MG tablet Take 1 tablet (40 mg total) by mouth daily. 07/25/17  Yes English,  Colletta Maryland D, PA  Calcium Carb-Cholecalciferol (CALCIUM + D3 PO) Take by mouth.   Yes [provider]  cetirizine (ZYRTEC) 10 MG tablet Take 1 tablet (10 mg total) by mouth daily. 07/25/17  Yes English, Colletta Maryland D, PA  hydrochlorothiazide (HYDRODIURIL) 25 MG tablet Take 1 tablet (25 mg total) by mouth daily. 11/10/17  Yes Weber, Damaris Hippo, PA-C  meloxicam (MOBIC) 7.5 MG tablet Take 7.5 mg by mouth daily.   Yes [provider]  triamcinolone cream (KENALOG) 0.5 % Apply 1 application topically 2 (two) times daily. 11/15/17  Yes Weber, Damaris Hippo, PA-C   Social History   Socioeconomic History  . Marital status: Married    Spouse name: Not on file  . Number of children: 4  . Years of education: Not on file  . Highest education level: Not on file  Occupational History  . Not on file  Social Needs  . Financial resource strain: Not on file  . Food insecurity:    Worry: Not on file    Inability: Not on file  . Transportation needs:    Medical: Not on file    Non-medical: Not on file  Tobacco Use  . Smoking status: Former Smoker    Packs/day: 0.25    Years: 15.00    Pack years: 3.75    Types: Cigarettes  . Smokeless tobacco: Never Used  Substance and Sexual Activity  . Alcohol use: Yes    Alcohol/week: 1.0  standard drinks    Types: 1 Cans of beer per week    Comment: one beer on the holidays  . Drug use: No  . Sexual activity: Yes    Birth control/protection: Surgical  Lifestyle  . Physical activity:    Days per week: Not on file    Minutes per session: Not on file  . Stress: Not on file  Relationships  . Social connections:    Talks on phone: Not on file    Gets together: Not on file    Attends religious service: Not on file    Active member of club or organization: Not on file    Attends meetings of clubs or organizations: Not on file    Relationship status: Not on file  . Intimate partner violence:    Fear of current or ex partner: Not on file    Emotionally  abused: Not on file    Physically abused: Not on file    Forced sexual activity: Not on file  Other Topics Concern  . Not on file  Social History Narrative   Lives in [place] with husband.  Children are 21, 19, and 14.  All live at home.   Review of Systems  Constitutional: Positive for diaphoresis. Negative for fever and unexpected weight change.  Cardiovascular: Negative for chest pain.  Gastrointestinal: Positive for abdominal pain, blood in stool (resolved), nausea and vomiting.      Objective:   Physical Exam  Constitutional: She is oriented to person, place, and time. She appears well-developed and well-nourished. No distress.  HENT:  Head: Normocephalic and atraumatic.  Eyes: Conjunctivae and EOM are normal.  Neck: Neck supple. No tracheal deviation present.  Cardiovascular: Normal rate.  Pulmonary/Chest: Effort normal. No respiratory distress.  Abdominal: There is tenderness (minimal) in the epigastric area.  Musculoskeletal: Normal range of motion.  Neurological: She is alert and oriented to person, place, and time.  Skin: Skin is warm and dry.  Psychiatric: She has a normal mood and affect. Her behavior is normal.  Nursing note and vitals reviewed.  Vitals:   12/29/17 0951  BP: 109/73  Pulse: 68  Temp: 98.1 F (36.7 C)  TempSrc: Oral  SpO2: 96%  Weight: 113 lb (51.3 kg)  Height: 5' (1.524 m)  EKG reading done by Wendie Agreste, MD: sinus rhythm nonspecific inferiorly but no apparent Q wave. Normal T waves. No acute findings.    Assessment & Plan:    Monica Gutierrez is a 62 y.o. female Heartburn - Plan: H. pylori breath test, omeprazole (PRILOSEC) 20 MG capsule, Ambulatory referral to Gastroenterology, CANCELED: Ambulatory referral to Gastroenterology  Abdominal pain, epigastric - Plan: Comprehensive metabolic panel, CBC, H. pylori breath test, Ambulatory referral to Gastroenterology, CANCELED: Ambulatory referral to Gastroenterology  Chest pain,  unspecified type - Plan: EKG 12-Lead  Nausea and vomiting, intractability of vomiting not specified, unspecified vomiting type - Plan: Comprehensive metabolic panel, Lipase, Ambulatory referral to Gastroenterology, CANCELED: Ambulatory referral to Gastroenterology  Suspected GERD, with similar in past.  No acute findings on EKG.  -Start omeprazole 20 mg daily, continue Zantac at bedtime.  Trigger avoidance discussed and handout given  -Check H. pylori, CBC, CMP, lipase given abdominal pain with vomiting.  Vital signs reassuring in office.   -Refer to gastroenterology, Dr. Benson Norway  -If any worsening chest pain, advised to be seen in the emergency room/911 if needed.  Understanding expressed  Meds ordered this encounter  Medications  . omeprazole (PRILOSEC) 20 MG  capsule    Sig: Take 1 capsule (20 mg total) by mouth daily.    Dispense:  30 capsule    Refill:  1   Patient Instructions   Chest symptoms are likely due to heartburn.  Start omeprazole once in the morning, okay to continue Zantac at bedtime.  See foods to avoid below and other information.  I will refer you back to Dr. Benson Norway for further evaluation.   If any worsening chest pain, proceed to the emergency room, but at this time that appears to be due to your heartburn.  Return to the clinic or go to the nearest emergency room if any of your symptoms worsen or new symptoms occur.   Food Choices for Gastroesophageal Reflux Disease, Adult When you have gastroesophageal reflux disease (GERD), the foods you eat and your eating habits are very important. Choosing the right foods can help ease your discomfort. What guidelines do I need to follow?  Choose fruits, vegetables, whole grains, and low-fat dairy products.  Choose low-fat meat, fish, and poultry.  Limit fats such as oils, salad dressings, butter, nuts, and avocado.  Keep a food diary. This helps you identify foods that cause symptoms.  Avoid foods that cause symptoms. These  may be different for everyone.  Eat small meals often instead of 3 large meals a day.  Eat your meals slowly, in a place where you are relaxed.  Limit fried foods.  Cook foods using methods other than frying.  Avoid drinking alcohol.  Avoid drinking large amounts of liquids with your meals.  Avoid bending over or lying down until 2-3 hours after eating. What foods are not recommended? These are some foods and drinks that may make your symptoms worse: Vegetables Tomatoes. Tomato juice. Tomato and spaghetti sauce. Chili peppers. Onion and garlic. Horseradish. Fruits Oranges, grapefruit, and lemon (fruit and juice). Meats High-fat meats, fish, and poultry. This includes hot dogs, ribs, ham, sausage, salami, and bacon. Dairy Whole milk and chocolate milk. Sour cream. Cream. Butter. Ice cream. Cream cheese. Drinks Coffee and tea. Bubbly (carbonated) drinks or energy drinks. Condiments Hot sauce. Barbecue sauce. Sweets/Desserts Chocolate and cocoa. Donuts. Peppermint and spearmint. Fats and Oils High-fat foods. This includes [place] fries and potato chips. Other Vinegar. Strong spices. This includes black pepper, white pepper, red pepper, cayenne, curry powder, cloves, ginger, and chili powder. The items listed above may not be a complete list of foods and drinks to avoid. Contact your dietitian for more information. This information is not intended to replace advice given to you by your health care provider. Make sure you discuss any questions you have with your health care provider. Document Released: 09/21/2011 Document Revised: 08/28/2015 Document Reviewed: 01/24/2013 Elsevier Interactive Patient Education  2017 Elsevier Inc.  Nonspecific Chest Pain Chest pain can be caused by many different conditions. There is always a chance that your pain could be related to something serious, such as a heart attack or a blood clot in your lungs. Chest pain can also be caused by conditions  that are not life-threatening. If you have chest pain, it is very important to follow up with your health care provider. What are the causes? Causes of this condition include:  Heartburn.  Pneumonia or bronchitis.  Anxiety or stress.  Inflammation around your heart (pericarditis) or lung (pleuritis or pleurisy).  A blood clot in your lung.  A collapsed lung (pneumothorax). This can develop suddenly on its own (spontaneous pneumothorax) or from trauma to the chest.  Shingles  infection (varicella-zoster virus).  Heart attack.  Damage to the bones, muscles, and cartilage that make up your chest wall. This can include: ? Bruised bones due to injury. ? Strained muscles or cartilage due to frequent or repeated coughing or overwork. ? Fracture to one or more ribs. ? Sore cartilage due to inflammation (costochondritis).  What increases the risk? Risk factors for this condition may include:  Activities that increase your risk for trauma or injury to your chest.  Respiratory infections or conditions that cause frequent coughing.  Medical conditions or overeating that can cause heartburn.  Heart disease or family history of heart disease.  Conditions or health behaviors that increase your risk of developing a blood clot.  Having had chicken pox (varicella zoster).  What are the signs or symptoms? Chest pain can feel like:  Burning or tingling on the surface of your chest or deep in your chest.  Crushing, pressure, aching, or squeezing pain.  Dull or sharp pain that is worse when you move, cough, or take a deep breath.  Pain that is also felt in your back, neck, shoulder, or arm, or pain that spreads to any of these areas.  Your chest pain may come and go, or it may stay constant. How is this diagnosed? Lab tests or other studies may be needed to find the cause of your pain. Your health care provider may have you take a test called an ECG (electrocardiogram). An ECG records  your heartbeat patterns at the time the test is performed. You may also have other tests, such as:  Transthoracic echocardiogram (TTE). In this test, sound waves are used to create a picture of the heart structures and to look at how blood flows through your heart.  Transesophageal echocardiogram (TEE).This is a more advanced imaging test that takes images from inside your body. It allows your health care provider to see your heart in finer detail.  Cardiac monitoring. This allows your health care provider to monitor your heart rate and rhythm in real time.  Holter monitor. This is a portable device that records your heartbeat and can help to diagnose abnormal heartbeats. It allows your health care provider to track your heart activity for several days, if needed.  Stress tests. These can be done through exercise or by taking medicine that makes your heart beat more quickly.  Blood tests.  Other imaging tests.  How is this treated? Treatment depends on what is causing your chest pain. Treatment may include:  Medicines. These may include: ? Acid blockers for heartburn. ? Anti-inflammatory medicine. ? Pain medicine for inflammatory conditions. ? Antibiotic medicine, if an infection is present. ? Medicines to dissolve blood clots. ? Medicines to treat coronary artery disease (CAD).  Supportive care for conditions that do not require medicines. This may include: ? Resting. ? Applying heat or cold packs to injured areas. ? Limiting activities until pain decreases.  Follow these instructions at home: Medicines  If you were prescribed an antibiotic, take it as told by your health care provider. Do not stop taking the antibiotic even if you start to feel better.  Take over-the-counter and prescription medicines only as told by your health care provider. Lifestyle  Do not use any products that contain nicotine or tobacco, such as cigarettes and e-cigarettes. If you need help quitting,  ask your health care provider.  Do not drink alcohol.  Make lifestyle changes as directed by your health care provider. These may include: ? Getting regular exercise. Ask  your health care provider to suggest some activities that are safe for you. ? Eating a heart-healthy diet. A registered dietitian can help you to learn healthy eating options. ? Maintaining a healthy weight. ? Managing diabetes, if necessary. ? Reducing stress, such as with yoga or relaxation techniques. General instructions  Avoid any activities that bring on chest pain.  If heartburn is the cause for your chest pain, raise (elevate) the head of your bed about 6 inches (15 cm) by putting blocks under the legs. Sleeping with more pillows does not effectively relieve heartburn because it only changes the position of your head.  Keep all follow-up visits as told by your health care provider. This is important. This includes any further testing if your chest pain does not go away. Contact a health care provider if:  Your chest pain does not go away.  You have a rash with blisters on your chest.  You have a fever.  You have chills. Get help right away if:  Your chest pain is worse.  You have a cough that gets worse, or you cough up blood.  You have severe pain in your abdomen.  You have severe weakness.  You faint.  You have sudden, unexplained chest discomfort.  You have sudden, unexplained discomfort in your arms, back, neck, or jaw.  You have shortness of breath at any time.  You suddenly start to sweat, or your skin gets clammy.  You feel nauseous or you vomit.  You suddenly feel light-headed or dizzy.  Your heart begins to beat quickly, or it feels like it is skipping beats. These symptoms may represent a serious problem that is an emergency. Do not wait to see if the symptoms will go away. Get medical help right away. Call your local emergency services (911 in the [place] Do not drive yourself to  the hospital. This information is not intended to replace advice given to you by your health care provider. Make sure you discuss any questions you have with your health care provider. Document Released: 12/30/2004 Document Revised: 12/15/2015 Document Reviewed: 12/15/2015 Elsevier Interactive Patient Education  [employer].   If you have lab work done today you will be contacted with your lab results within the next 2 weeks.  If you have not heard from [place] then please contact us. The fastest way to get your results is to register for My Chart.   IF you received an x-ray today, you will receive an invoice from [facility]. Please contact [facility] at [phone] with questions or concerns regarding your invoice.   IF you received labwork today, you will receive an invoice from [facility]. Please contact [facility] at [phone] with questions or concerns regarding your invoice.   Our billing staff will not be able to assist you with questions regarding bills from these companies.  You will be contacted with the lab results as soon as they are available. The fastest way to get your results is to activate your My Chart account. Instructions are located on the last page of this paperwork. If you have not heard from [place] regarding the results in 2 weeks, please contact this office.       I personally performed the services described in this documentation, which was scribed in my presence. The recorded information has been reviewed and considered for accuracy and completeness, addended by me as needed, and agree with information above.  Signed,   Merri Ray, MD Primary [facility] at [facility].  12/31/17 7:59 AM

## 2017-12-30 LAB — COMPREHENSIVE METABOLIC PANEL
ALBUMIN: 5 g/dL — AB (ref 3.6–4.8)
ALT: 14 IU/L (ref 0–32)
AST: 20 IU/L (ref 0–40)
Albumin/Globulin Ratio: 2.1 (ref 1.2–2.2)
Alkaline Phosphatase: 73 IU/L (ref 39–117)
BILIRUBIN TOTAL: 0.6 mg/dL (ref 0.0–1.2)
BUN / CREAT RATIO: 22 (ref 12–28)
BUN: 17 mg/dL (ref 8–27)
CO2: 25 mmol/L (ref 20–29)
CREATININE: 0.76 mg/dL (ref 0.57–1.00)
Calcium: 9.7 mg/dL (ref 8.7–10.3)
Chloride: 99 mmol/L (ref 96–106)
GFR calc non Af Amer: 85 mL/min/{1.73_m2} (ref >59)
GFR, EST AFRICAN AMERICAN: 98 mL/min/{1.73_m2} (ref >59)
GLOBULIN, TOTAL: 2.4 g/dL (ref 1.5–4.5)
GLUCOSE: 101 mg/dL — AB (ref 65–99)
Potassium: 3.9 mmol/L (ref 3.5–5.2)
Sodium: 140 mmol/L (ref 134–144)
TOTAL PROTEIN: 7.4 g/dL (ref 6.0–8.5)

## 2017-12-30 LAB — CBC
HEMATOCRIT: 42.1 % (ref 34.0–46.6)
Hemoglobin: 13.8 g/dL (ref 11.1–15.9)
MCH: 31.9 pg (ref 26.6–33.0)
MCHC: 32.8 g/dL (ref 31.5–35.7)
MCV: 98 fL — AB (ref 79–97)
Platelets: 206 10*3/uL (ref 150–450)
RBC: 4.32 x10E6/uL (ref 3.77–5.28)
RDW: 13.1 % (ref 12.3–15.4)
WBC: 6.3 10*3/uL (ref 3.4–10.8)

## 2017-12-30 LAB — H. PYLORI BREATH TEST: H pylori Breath Test: POSITIVE — AB

## 2017-12-30 LAB — H. PYLORI BREATH COLLECTION

## 2017-12-30 LAB — LIPASE: Lipase: 41 U/L (ref 14–72)

## 2017-12-31 ENCOUNTER — Ambulatory Visit: Payer: BLUE CROSS/BLUE SHIELD | Admitting: Family Medicine

## 2017-12-31 ENCOUNTER — Encounter: Payer: Self-pay | Admitting: Family Medicine

## 2018-01-11 ENCOUNTER — Telehealth: Payer: Self-pay | Admitting: Family Medicine

## 2018-01-11 NOTE — Telephone Encounter (Signed)
PATIENT CANCELLED HER APPNT STATING SHE DOES NOT NEED IT RIGHT NOW [facility] CX LETTER IN DOCUMENTS 10/09

## 2018-01-17 ENCOUNTER — Encounter: Payer: Self-pay | Admitting: Family Medicine

## 2018-01-31 ENCOUNTER — Ambulatory Visit: Payer: BLUE CROSS/BLUE SHIELD | Admitting: Family Medicine

## 2018-03-17 ENCOUNTER — Encounter: Payer: Self-pay | Admitting: Family Medicine

## 2018-03-17 NOTE — Progress Notes (Signed)
Surgical Pathology report: Normal esophagus. Gastritis. Biopsied. Normal examined duodenum. One 3 mm polyp in the sigmoid colon, removed with a cold snare. Resected and retrieved

## 2018-06-14 ENCOUNTER — Ambulatory Visit: Payer: BLUE CROSS/BLUE SHIELD | Admitting: Family Medicine

## 2018-08-04 ENCOUNTER — Ambulatory Visit: Payer: BLUE CROSS/BLUE SHIELD | Admitting: Family Medicine

## 2018-09-08 ENCOUNTER — Telehealth: Payer: Self-pay | Admitting: Family Medicine

## 2018-09-08 NOTE — Telephone Encounter (Signed)
Copied from [place] [phone]. Topic: Quick Communication - Rx Refill/Question >> Sep 08, 2018 10:31 AM Richardo Priest, NT wrote: Medication:  Atorvastatin Omeprazole   Has the patient contacted their pharmacy? Yes Patient states they advised her to call as phamarcy has not received any refills.  Preferred Pharmacy (with phone number or street name):  [place], [place] - [address] [phone] (Phone) [phone] (Fax)  Agent: Please be advised that RX refills may take up to 3 business days. We ask that you follow-up with your pharmacy.

## 2018-09-11 ENCOUNTER — Other Ambulatory Visit: Payer: Self-pay

## 2018-09-11 DIAGNOSIS — R12 Heartburn: Secondary | ICD-10-CM

## 2018-09-11 DIAGNOSIS — E785 Hyperlipidemia, unspecified: Secondary | ICD-10-CM

## 2018-09-11 MED ORDER — OMEPRAZOLE 20 MG PO CPDR: 20.0000 mg | DELAYED_RELEASE_CAPSULE | Freq: Every day | ORAL | 0 refills | Status: AC

## 2018-09-11 MED ORDER — ATORVASTATIN CALCIUM 40 MG PO TABS: 40.0000 mg | ORAL_TABLET | Freq: Every day | ORAL | 0 refills | Status: AC

## 2018-09-11 NOTE — Telephone Encounter (Signed)
Called and informed pt that rx has been sent and she needs to make an appointment.

## 2018-09-11 NOTE — Telephone Encounter (Signed)
Pt calling to check on the status of this.

## 2018-09-18 ENCOUNTER — Other Ambulatory Visit: Payer: Self-pay

## 2018-09-18 DIAGNOSIS — I10 Essential (primary) hypertension: Secondary | ICD-10-CM

## 2018-09-18 MED ORDER — HYDROCHLOROTHIAZIDE 25 MG PO TABS: 25.0000 mg | ORAL_TABLET | Freq: Every day | ORAL | 0 refills | Status: AC

## 2018-10-06 ENCOUNTER — Ambulatory Visit: Payer: BLUE CROSS/BLUE SHIELD | Admitting: Family Medicine

## 2021-03-30 ENCOUNTER — Emergency Department (HOSPITAL_COMMUNITY)
Admission: EM | Admit: 2021-03-30 | Discharge: 2021-03-31 | Disposition: A | Discharge status: Home / Self Care | Payer: BLUE CROSS/BLUE SHIELD | Attending: Emergency Medicine | Admitting: Emergency Medicine

## 2021-03-30 ENCOUNTER — Other Ambulatory Visit: Payer: Self-pay

## 2021-03-30 DIAGNOSIS — Z87891 Personal history of nicotine dependence: Secondary | ICD-10-CM | POA: Diagnosis not present

## 2021-03-30 DIAGNOSIS — G8929 Other chronic pain: Secondary | ICD-10-CM | POA: Insufficient documentation

## 2021-03-30 DIAGNOSIS — I10 Essential (primary) hypertension: Secondary | ICD-10-CM | POA: Diagnosis not present

## 2021-03-30 DIAGNOSIS — Z79899 Other long term (current) drug therapy: Secondary | ICD-10-CM | POA: Insufficient documentation

## 2021-03-30 DIAGNOSIS — M25512 Pain in left shoulder: Secondary | ICD-10-CM

## 2021-03-30 MED ORDER — PREDNISONE 10 MG PO TABS: 20.0000 mg | ORAL_TABLET | Freq: Every day | ORAL | 0 refills | Status: AC

## 2021-03-30 MED ORDER — DEXAMETHASONE SODIUM PHOSPHATE 10 MG/ML IJ SOLN
10.0000 mg | Freq: Once | INTRAMUSCULAR | Status: AC
  Administered 2021-03-31: 10 mg via INTRAMUSCULAR
  Filled 2021-03-30: qty 1

## 2021-03-30 MED ORDER — CYCLOBENZAPRINE HCL 10 MG PO TABS: 10.0000 mg | ORAL_TABLET | Freq: Two times a day (BID) | ORAL | 0 refills | Status: AC | PRN

## 2021-03-30 MED ORDER — HYDROCODONE-ACETAMINOPHEN 5-325 MG PO TABS
1.0000 | ORAL_TABLET | Freq: Once | ORAL | Status: AC
  Administered 2021-03-31: 1 via ORAL
  Filled 2021-03-30: qty 1

## 2021-03-30 MED ORDER — LIDOCAINE 5 % EX PTCH: 1.0000 | MEDICATED_PATCH | Freq: Every day | CUTANEOUS | 0 refills | Status: AC | PRN

## 2021-03-30 MED ORDER — DEXAMETHASONE SODIUM PHOSPHATE 10 MG/ML IJ SOLN: 10.0000 mg | Freq: Once | INTRAMUSCULAR | Status: DC

## 2021-03-30 NOTE — Discharge Instructions (Addendum)
You were seen in the emergency department today for shoulder pain.  We are sending home with the following medicines: - Lidoderm patch: Apply 1 patch to area with significant pain once per day, remove and discard patch within 12 hours of application.  This to help numb/soothe the muscle area. - Prednisone: Take this daily for the next 3 days, this is a steroid to help with pain and inflammation.  We gave you a shot of steroids in the emergency department as well.  Take this with food as it can cause stomach irritation. -Flexeril: This is a muscle mass and, take this every 12 hours as needed for pain.  Do not drive or operate heavy machinery while taking Flexeril as it can make you sleepy.  Do not drink alcohol or take other sedating medications with this medicine.  You may take Tylenol with these medicines.  We have prescribed you new medication(s) today. Discuss the medications prescribed today with your pharmacist as they can have adverse effects and interactions with your other medicines including over the counter and prescribed medications. Seek medical evaluation if you start to experience new or abnormal symptoms after taking one of these medicines, seek care immediately if you start to experience difficulty breathing, feeling of your throat closing, facial swelling, or rash as these could be indications of a more serious allergic reaction  It is important that you follow-up with orthopedic doctor given the persistence of your pain.  Please call the [facility] phone number in your discharge instructions to schedule a follow-up appointment for soon as possible.  You may also follow-up with your primary care provider.  Return to the emergency department for new or worsening symptoms including but not limited to new or worsening pain, fever, numbness, weakness, chest pain, trouble breathing, or any other concerns.

## 2021-03-30 NOTE — ED Triage Notes (Signed)
Pt c/o left shoulder pain. Pt reports pain when she tries to lift her arm up.

## 2021-03-30 NOTE — ED Provider Notes (Signed)
[facility] EMERGENCY DEPARTMENT Provider Note   CSN: [id number] Arrival date & time: 03/30/21  2314     History Chief Complaint  Patient presents with   Shoulder Pain    Monica Gutierrez is a 65 y.o. female w/ a hx of HTN who presents to the emergency department with persistent left shoulder pain that has been ongoing for 3 months.  Patient states the pain is constant, it is worse with movement of her left upper extremity, at times it radiates down the left arm.  She reports she had an x-ray at urgent care at the beginning of this month that she was told was normal.  She has been given muscle relaxants and steroids, these helped somewhat temporarily for the pain is still there.  Pain is worse with movement.  She is right-hand dominant.  She did have her flu shot in that arm, denies any significant trauma otherwise.  She states that sometimes she gets some tingling in the arm.  She denies numbness, weakness, chest pain, shortness of breath, or fevers.  She has not seen an orthopedic doctor.  HPI     Past Medical History:  Diagnosis Date   Hypertension     Patient Active Problem List   Diagnosis Date Noted   Allergic rhinitis due to pollen 09/27/2017   Blood glucose elevated 09/27/2017   Rectal mucosa prolapse 11/04/2015   Essential hypertension, benign 02/16/2012   Colon polyps 02/16/2012    Past Surgical History:  Procedure Laterality Date   CESAREAN SECTION     COLON SURGERY  2010   5 polyps removed 2 years ago   TUBAL LIGATION       OB History   No obstetric history on file.     No family history on file.  Social History   Tobacco Use   Smoking status: Former    Packs/day: 0.25    Years: 15.00    Pack years: 3.75    Types: Cigarettes   Smokeless tobacco: Never  Vaping Use   Vaping Use: Never used  Substance Use Topics   Alcohol use: Yes    Alcohol/week: 1.0 standard drink    Types: 1 Cans of beer per week    Comment: one beer on the  holidays   Drug use: No    Home Medications Prior to Admission medications   Medication Sig Start Date End Date Taking? Authorizing Provider  atorvastatin (LIPITOR) 40 MG tablet Take 1 tablet (40 mg total) by mouth daily. 09/11/18   Forrest Moron, MD  Calcium Carb-Cholecalciferol (CALCIUM + D3 PO) Take by mouth.    [provider]  cetirizine (ZYRTEC) 10 MG tablet Take 1 tablet (10 mg total) by mouth daily. 07/25/17   Ivar Drape D, PA  hydrochlorothiazide (HYDRODIURIL) 25 MG tablet Take 1 tablet (25 mg total) by mouth daily. 09/18/18   Forrest Moron, MD  meloxicam (MOBIC) 7.5 MG tablet Take 7.5 mg by mouth daily.    [provider]  omeprazole (PRILOSEC) 20 MG capsule Take 1 capsule (20 mg total) by mouth daily. 09/11/18   Forrest Moron, MD  triamcinolone cream (KENALOG) 0.5 % Apply 1 application topically 2 (two) times daily. 11/15/17   Gale Journey, Damaris Hippo, PA-C    Allergies    Patient has no known allergies.  Review of Systems   Review of Systems  Constitutional:  Negative for chills and fever.  Respiratory:  Negative for shortness of breath.   Cardiovascular:  Negative for chest pain.  Gastrointestinal:  Negative for abdominal pain.  Musculoskeletal:  Positive for arthralgias.  Neurological:  Negative for weakness and numbness.       Positive for intermittent paresthesias.   All other systems reviewed and are negative.  Physical Exam Updated Vital Signs BP (!) 129/91 (BP Location: Left Arm)    Pulse 75    Temp 98.3 F (36.8 C)    Resp 17    Ht 5' (1.524 m)    Wt 51.3 kg    SpO2 100%    BMI 22.09 kg/m   Physical Exam Vitals and nursing note reviewed.  Constitutional:      General: She is not in acute distress.    Appearance: Normal appearance. She is not ill-appearing or toxic-appearing.  HENT:     Head: Normocephalic and atraumatic.  Neck:     Comments: No midline tenderness.  Cardiovascular:     Rate and Rhythm: Normal rate.     Pulses:           Radial pulses are 2+ on the right side and 2+ on the left side.  Pulmonary:     Effort: No respiratory distress.     Breath sounds: Normal breath sounds.  Musculoskeletal:     Cervical back: Normal range of motion and neck supple. No spinous process tenderness.     Comments: Upper extremities: No obvious deformity, appreciable swelling, edema, erythema, ecchymosis, warmth, or open wounds. Patient has intact AROM throughout with the exception of left shoulder flexion & scaption limited to just above 90 degrees. Tender to palpation to the left glenohumeral joint, trapezius, and supraspinatus muscles. Otherwise no significant tenderness to palpation. Compartments are soft.  Pain with empty can test with LUE.   Skin:    General: Skin is warm and dry.     Capillary Refill: Capillary refill takes less than 2 seconds.  Neurological:     Mental Status: She is alert.     Comments: Alert. Clear speech. Sensation grossly intact to bilateral upper extremities. 5/5 symmetric grip strength and elbow strength with flexion/extension. Able to flex/extend the shoulders against resistance as well, some pain with left shoulder flexion but good strength with this. Ambulatory.   Psychiatric:        Mood and Affect: Mood normal.        Behavior: Behavior normal.    ED Results / Procedures / Treatments   Labs (all labs ordered are listed, but only abnormal results are displayed) Labs Reviewed - No data to display  EKG None  Radiology No results found.  Procedures Procedures   Medications Ordered in ED Medications  HYDROcodone-acetaminophen (NORCO/VICODIN) 5-325 MG per tablet 1 tablet (1 tablet Oral Given 03/31/21 0012)  dexamethasone (DECADRON) injection 10 mg (10 mg Intramuscular Given 03/31/21 0012)    ED Course  I have reviewed the triage vital signs and the nursing notes.  Pertinent labs & imaging results that were available during my care of the patient were reviewed by me and considered in  my medical decision making (see chart for details).    MDM Rules/Calculators/A&P                         Patient presents to the ED with complaints off persistent left shoulder pain. Nontoxic, vitals w/ mildly elevated diastolic BP, otherwise unremarkable.   Additional history obtained:  Additional history obtained from chart review & nursing note review.  Left shoulder xray 12/04-  negative.   ED Course:  Exam without erythema, increased warmth and patient is afebrile does not seem consistent with septic joint or cellulitis. No rash to suggest shingles. No recent trauma, recent negative xray, discussed option of ED repeat with the patient who has declined, I doubt fx/dislocation. Patient with some intermittent paresthesias reported, no significant neuro deficits on exam to suggest critical cord compression. No midline spinal tenderness. Unclear definitive etiology, considering rotator cuff pathology. Patient discussed option of MRI, she does not appear to require this emergently, however feel she would benefit from orthopedics follow up for further evaluation & management given this has persisted for 3 months now. Supportive care provided, orthopedics information provided. I discussed treatment plan, need for follow-up, and return precautions with the patient. Provided opportunity for questions, patient confirmed understanding and is in agreement with plan.   Portions of this note were generated with [employer]. Dictation errors may occur despite best attempts at proofreading.     Final Clinical Impression(s) / ED Diagnoses Final diagnoses:  Chronic left shoulder pain    Rx / DC Orders ED Discharge Orders          Ordered    predniSONE (DELTASONE) 10 MG tablet  Daily        03/30/21 2353    lidocaine (LIDODERM) 5 %  Daily PRN        03/30/21 2353    cyclobenzaprine (FLEXERIL) 10 MG tablet  2 times daily PRN        03/30/21 2353             Amaryllis Dyke, PA-C 03/31/21 0030    Merryl Hacker, MD 03/31/21 [phone]

## 2021-03-31 NOTE — ED Notes (Signed)
Pt discharged from triage without difficulty.

## 2022-11-18 ENCOUNTER — Encounter: Payer: Self-pay | Admitting: Internal Medicine

## 2022-11-18 ENCOUNTER — Other Ambulatory Visit: Payer: Self-pay | Admitting: Orthopaedic Surgery

## 2022-11-18 DIAGNOSIS — M5416 Radiculopathy, lumbar region: Secondary | ICD-10-CM

## 2022-11-18 DIAGNOSIS — Z1231 Encounter for screening mammogram for malignant neoplasm of breast: Secondary | ICD-10-CM

## 2022-11-29 ENCOUNTER — Encounter: Payer: Self-pay | Admitting: Orthopaedic Surgery

## 2022-12-03 ENCOUNTER — Ambulatory Visit
Admission: RE | Admit: 2022-12-03 | Discharge: 2022-12-03 | Disposition: A | Discharge status: Home / Self Care | Payer: Medicare HMO | Source: Ambulatory Visit | Attending: Orthopaedic Surgery | Admitting: Orthopaedic Surgery

## 2022-12-03 DIAGNOSIS — M5416 Radiculopathy, lumbar region: Secondary | ICD-10-CM
